# Patient Record
Sex: Female | Born: 1945 | Race: White | Hispanic: No | Marital: Married | Smoking: Former smoker
Health system: Southern US, Community
[De-identification: ages and names within clinical notes are randomized; demographics above are authoritative.]

## PROBLEM LIST (undated history)

## (undated) DIAGNOSIS — F40243 Fear of flying: Secondary | ICD-10-CM

## (undated) DIAGNOSIS — S43006A Unspecified dislocation of unspecified shoulder joint, initial encounter: Secondary | ICD-10-CM

## (undated) DIAGNOSIS — I10 Essential (primary) hypertension: Secondary | ICD-10-CM

## (undated) DIAGNOSIS — Z860100 Personal history of colon polyps, unspecified: Secondary | ICD-10-CM

## (undated) HISTORY — DX: Personal history of colonic polyps: Z86.010

## (undated) HISTORY — DX: Essential (primary) hypertension: I10

## (undated) HISTORY — DX: Unspecified dislocation of unspecified shoulder joint, initial encounter: S43.006A

## (undated) HISTORY — DX: Personal history of colon polyps, unspecified: Z86.0100

## (undated) HISTORY — DX: Fear of flying: F40.243

---

## 1997-01-22 HISTORY — PX: HERNIA REPAIR: SHX51

## 1997-08-25 ENCOUNTER — Other Ambulatory Visit: Admission: RE | Admit: 1997-08-25 | Discharge: 1997-08-25 | Payer: Self-pay | Admitting: *Deleted

## 1998-02-17 ENCOUNTER — Ambulatory Visit (HOSPITAL_BASED_OUTPATIENT_CLINIC_OR_DEPARTMENT_OTHER): Admission: RE | Admit: 1998-02-17 | Discharge: 1998-02-17 | Payer: Self-pay | Admitting: General Surgery

## 1998-11-07 ENCOUNTER — Other Ambulatory Visit: Admission: RE | Admit: 1998-11-07 | Discharge: 1998-11-07 | Payer: Self-pay | Admitting: *Deleted

## 1999-12-28 ENCOUNTER — Other Ambulatory Visit: Admission: RE | Admit: 1999-12-28 | Discharge: 1999-12-28 | Payer: Self-pay | Admitting: *Deleted

## 2000-02-22 ENCOUNTER — Other Ambulatory Visit: Admission: RE | Admit: 2000-02-22 | Discharge: 2000-02-22 | Payer: Self-pay | Admitting: Internal Medicine

## 2000-02-22 ENCOUNTER — Encounter (INDEPENDENT_AMBULATORY_CARE_PROVIDER_SITE_OTHER): Payer: Self-pay | Admitting: Specialist

## 2001-02-25 ENCOUNTER — Other Ambulatory Visit: Admission: RE | Admit: 2001-02-25 | Discharge: 2001-02-25 | Payer: Self-pay | Admitting: *Deleted

## 2002-03-05 ENCOUNTER — Other Ambulatory Visit: Admission: RE | Admit: 2002-03-05 | Discharge: 2002-03-05 | Payer: Self-pay | Admitting: *Deleted

## 2002-11-10 ENCOUNTER — Encounter: Payer: Self-pay | Admitting: Rheumatology

## 2002-11-10 ENCOUNTER — Encounter: Admission: RE | Admit: 2002-11-10 | Discharge: 2002-11-10 | Payer: Self-pay | Admitting: Rheumatology

## 2004-06-02 ENCOUNTER — Ambulatory Visit: Payer: Self-pay | Admitting: Family Medicine

## 2004-07-03 ENCOUNTER — Ambulatory Visit: Payer: Self-pay | Admitting: Internal Medicine

## 2004-07-17 ENCOUNTER — Ambulatory Visit: Payer: Self-pay | Admitting: Family Medicine

## 2004-07-19 ENCOUNTER — Ambulatory Visit: Payer: Self-pay | Admitting: Internal Medicine

## 2005-04-11 ENCOUNTER — Ambulatory Visit: Payer: Self-pay | Admitting: Family Medicine

## 2005-10-16 ENCOUNTER — Ambulatory Visit: Payer: Self-pay | Admitting: Family Medicine

## 2005-10-23 ENCOUNTER — Ambulatory Visit: Payer: Self-pay | Admitting: Family Medicine

## 2006-05-10 ENCOUNTER — Encounter: Admission: RE | Admit: 2006-05-10 | Discharge: 2006-05-10 | Payer: Self-pay | Admitting: *Deleted

## 2007-02-12 DIAGNOSIS — J45991 Cough variant asthma: Secondary | ICD-10-CM

## 2007-03-07 ENCOUNTER — Ambulatory Visit: Payer: Self-pay | Admitting: Family Medicine

## 2007-03-21 ENCOUNTER — Ambulatory Visit: Payer: Self-pay | Admitting: Family Medicine

## 2007-03-21 LAB — CONVERTED CEMR LAB
ALT: 17 units/L (ref 0–35)
AST: 21 units/L (ref 0–37)
Albumin: 4 g/dL (ref 3.5–5.2)
Alkaline Phosphatase: 63 units/L (ref 39–117)
BUN: 8 mg/dL (ref 6–23)
Basophils Absolute: 0.1 10*3/uL (ref 0.0–0.1)
Basophils Relative: 0.9 % (ref 0.0–1.0)
Bilirubin, Direct: 0.2 mg/dL (ref 0.0–0.3)
CO2: 29 meq/L (ref 19–32)
Calcium: 9.7 mg/dL (ref 8.4–10.5)
Chloride: 100 meq/L (ref 96–112)
Cholesterol: 239 mg/dL (ref 0–200)
Creatinine, Ser: 0.9 mg/dL (ref 0.4–1.2)
Direct LDL: 131.5 mg/dL
Eosinophils Absolute: 0.1 10*3/uL (ref 0.0–0.6)
Eosinophils Relative: 1.1 % (ref 0.0–5.0)
GFR calc Af Amer: 82 mL/min
GFR calc non Af Amer: 68 mL/min
Glucose, Bld: 82 mg/dL (ref 70–99)
HCT: 40.7 % (ref 36.0–46.0)
HDL: 89.7 mg/dL (ref >39.0)
Hemoglobin: 13.6 g/dL (ref 12.0–15.0)
Lymphocytes Relative: 35.7 % (ref 12.0–46.0)
MCHC: 33.4 g/dL (ref 30.0–36.0)
MCV: 90.5 fL (ref 78.0–100.0)
Monocytes Absolute: 0.9 10*3/uL — ABNORMAL HIGH (ref 0.2–0.7)
Monocytes Relative: 6.7 % (ref 3.0–11.0)
Neutro Abs: 7.1 10*3/uL (ref 1.4–7.7)
Neutrophils Relative %: 55.6 % (ref 43.0–77.0)
Platelets: 329 10*3/uL (ref 150–400)
Potassium: 3.6 meq/L (ref 3.5–5.1)
RBC: 4.49 M/uL (ref 3.87–5.11)
RDW: 13.5 % (ref 11.5–14.6)
Sodium: 140 meq/L (ref 135–145)
TSH: 1.11 microintl units/mL (ref 0.35–5.50)
Total Bilirubin: 1.1 mg/dL (ref 0.3–1.2)
Total CHOL/HDL Ratio: 2.7
Total Protein: 6.7 g/dL (ref 6.0–8.3)
Triglycerides: 82 mg/dL (ref 0–149)
VLDL: 16 mg/dL (ref 0–40)
WBC: 12.8 10*3/uL — ABNORMAL HIGH (ref 4.5–10.5)

## 2007-04-10 ENCOUNTER — Ambulatory Visit: Payer: Self-pay | Admitting: Family Medicine

## 2007-04-10 DIAGNOSIS — I10 Essential (primary) hypertension: Secondary | ICD-10-CM | POA: Insufficient documentation

## 2007-05-28 ENCOUNTER — Encounter: Admission: RE | Admit: 2007-05-28 | Discharge: 2007-05-28 | Payer: Self-pay | Admitting: *Deleted

## 2007-07-22 ENCOUNTER — Telehealth: Payer: Self-pay | Admitting: *Deleted

## 2007-10-09 ENCOUNTER — Ambulatory Visit: Payer: Self-pay | Admitting: Family Medicine

## 2008-04-13 ENCOUNTER — Telehealth: Payer: Self-pay | Admitting: Family Medicine

## 2008-05-14 ENCOUNTER — Ambulatory Visit: Payer: Self-pay | Admitting: Family Medicine

## 2008-05-14 LAB — CONVERTED CEMR LAB
ALT: 20 units/L (ref 0–35)
AST: 23 units/L (ref 0–37)
Albumin: 4.4 g/dL (ref 3.5–5.2)
Alkaline Phosphatase: 73 units/L (ref 39–117)
BUN: 11 mg/dL (ref 6–23)
Basophils Absolute: 0.1 10*3/uL (ref 0.0–0.1)
Basophils Relative: 1 % (ref 0.0–3.0)
Bilirubin, Direct: 0.1 mg/dL (ref 0.0–0.3)
CO2: 29 meq/L (ref 19–32)
Calcium: 9.7 mg/dL (ref 8.4–10.5)
Chloride: 104 meq/L (ref 96–112)
Cholesterol: 273 mg/dL — ABNORMAL HIGH (ref 0–200)
Creatinine, Ser: 0.8 mg/dL (ref 0.4–1.2)
Direct LDL: 170.1 mg/dL
Eosinophils Absolute: 0.1 10*3/uL (ref 0.0–0.7)
Eosinophils Relative: 1.3 % (ref 0.0–5.0)
GFR calc non Af Amer: 77.01 mL/min (ref >60)
Glucose, Bld: 115 mg/dL — ABNORMAL HIGH (ref 70–99)
HCT: 38.1 % (ref 36.0–46.0)
HDL: 59.9 mg/dL (ref >39.00)
Hemoglobin: 13.2 g/dL (ref 12.0–15.0)
Lymphocytes Relative: 26.1 % (ref 12.0–46.0)
Lymphs Abs: 1.7 10*3/uL (ref 0.7–4.0)
MCHC: 34.6 g/dL (ref 30.0–36.0)
MCV: 89.3 fL (ref 78.0–100.0)
Monocytes Absolute: 0.5 10*3/uL (ref 0.1–1.0)
Monocytes Relative: 7.4 % (ref 3.0–12.0)
Neutro Abs: 4.2 10*3/uL (ref 1.4–7.7)
Neutrophils Relative %: 64.2 % (ref 43.0–77.0)
Platelets: 257 10*3/uL (ref 150.0–400.0)
Potassium: 3.8 meq/L (ref 3.5–5.1)
RBC: 4.26 M/uL (ref 3.87–5.11)
RDW: 12.3 % (ref 11.5–14.6)
Sodium: 141 meq/L (ref 135–145)
TSH: 0.83 microintl units/mL (ref 0.35–5.50)
Total Bilirubin: 0.9 mg/dL (ref 0.3–1.2)
Total CHOL/HDL Ratio: 5
Total Protein: 7.7 g/dL (ref 6.0–8.3)
Triglycerides: 119 mg/dL (ref 0.0–149.0)
VLDL: 23.8 mg/dL (ref 0.0–40.0)
WBC: 6.6 10*3/uL (ref 4.5–10.5)

## 2008-05-21 ENCOUNTER — Ambulatory Visit: Payer: Self-pay | Admitting: Family Medicine

## 2008-05-25 ENCOUNTER — Telehealth: Payer: Self-pay | Admitting: Family Medicine

## 2008-07-15 ENCOUNTER — Ambulatory Visit: Payer: Self-pay | Admitting: Family Medicine

## 2008-07-15 LAB — CONVERTED CEMR LAB
ALT: 22 units/L (ref 0–35)
Albumin: 4.2 g/dL (ref 3.5–5.2)
Cholesterol: 168 mg/dL (ref 0–200)
HDL: 66.4 mg/dL (ref >39.00)
Total Protein: 7.3 g/dL (ref 6.0–8.3)
Triglycerides: 89 mg/dL (ref 0.0–149.0)
VLDL: 17.8 mg/dL (ref 0.0–40.0)

## 2008-07-22 ENCOUNTER — Ambulatory Visit: Payer: Self-pay | Admitting: Family Medicine

## 2008-07-22 DIAGNOSIS — E785 Hyperlipidemia, unspecified: Secondary | ICD-10-CM | POA: Insufficient documentation

## 2008-10-25 ENCOUNTER — Encounter: Admission: RE | Admit: 2008-10-25 | Discharge: 2008-10-25 | Payer: Self-pay | Admitting: Obstetrics & Gynecology

## 2008-11-11 ENCOUNTER — Telehealth: Payer: Self-pay | Admitting: Family Medicine

## 2009-06-03 ENCOUNTER — Ambulatory Visit: Payer: Self-pay | Admitting: Family Medicine

## 2009-06-07 DIAGNOSIS — C4490 Unspecified malignant neoplasm of skin, unspecified: Secondary | ICD-10-CM

## 2009-06-08 ENCOUNTER — Telehealth: Payer: Self-pay | Admitting: Family Medicine

## 2009-08-12 ENCOUNTER — Ambulatory Visit: Payer: Self-pay | Admitting: Family Medicine

## 2009-08-12 DIAGNOSIS — D729 Disorder of white blood cells, unspecified: Secondary | ICD-10-CM | POA: Insufficient documentation

## 2009-08-12 LAB — CONVERTED CEMR LAB
Basophils Relative: 0.7 % (ref 0.0–3.0)
Eosinophils Absolute: 0.1 10*3/uL (ref 0.0–0.7)
Lymphocytes Relative: 21.5 % (ref 12.0–46.0)
MCHC: 34.6 g/dL (ref 30.0–36.0)
Monocytes Relative: 7.8 % (ref 3.0–12.0)
Neutrophils Relative %: 69.5 % (ref 43.0–77.0)
RBC: 4.24 M/uL (ref 3.87–5.11)
WBC: 9.3 10*3/uL (ref 4.5–10.5)

## 2009-08-29 ENCOUNTER — Ambulatory Visit: Payer: Self-pay | Admitting: Family Medicine

## 2009-08-29 LAB — CONVERTED CEMR LAB
ALT: 15 units/L (ref 0–35)
Albumin: 4.3 g/dL (ref 3.5–5.2)
BUN: 13 mg/dL (ref 6–23)
Basophils Relative: 0.9 % (ref 0.0–3.0)
Chloride: 102 meq/L (ref 96–112)
Cholesterol: 154 mg/dL (ref 0–200)
Eosinophils Relative: 2.5 % (ref 0.0–5.0)
HCT: 35.7 % — ABNORMAL LOW (ref 36.0–46.0)
Lymphs Abs: 1.8 10*3/uL (ref 0.7–4.0)
MCV: 90.6 fL (ref 78.0–100.0)
Monocytes Absolute: 0.5 10*3/uL (ref 0.1–1.0)
Platelets: 278 10*3/uL (ref 150.0–400.0)
Potassium: 4.4 meq/L (ref 3.5–5.1)
RBC: 3.94 M/uL (ref 3.87–5.11)
TSH: 1.01 microintl units/mL (ref 0.35–5.50)
Total Protein: 6.8 g/dL (ref 6.0–8.3)
WBC: 5.8 10*3/uL (ref 4.5–10.5)

## 2009-09-06 ENCOUNTER — Ambulatory Visit: Payer: Self-pay | Admitting: Family Medicine

## 2009-09-07 ENCOUNTER — Ambulatory Visit: Payer: Self-pay | Admitting: Family Medicine

## 2010-02-02 ENCOUNTER — Telehealth: Payer: Self-pay | Admitting: *Deleted

## 2010-02-21 NOTE — Assessment & Plan Note (Signed)
Summary: tdap inj/njr   Nurse Visit   Allergies: 1)  ! Penicillin V Potassium (Penicillin V Potassium) 2)  ! Bactrim Ds (Sulfamethoxazole-Trimethoprim)  Immunizations Administered:  Tetanus Vaccine:    Vaccine Type: Tdap    Site: right deltoid    Mfr: GlaxoSmithKline    Dose: 0.5 ml    Route: IM    Given by: Kern Reap CMA (AAMA)    Exp. Date: 11/11/2011    Lot #: [id number]    VIS given: 12/10/06 version given September 07, 2009.    Physician counseled: yes  Orders Added: 1)  Tdap => 3yrs IM [90715] 2)  Admin 1st Vaccine [57846]

## 2010-02-21 NOTE — Progress Notes (Signed)
Summary: [facility] -Pt sch for Mohs surgery 06/14/09  Phone Note Call from Patient   Caller: [facility] - [place] Summary of Call: Pt has been for Mohs surgery 06/14/09 at 11:00am with Dr. Irene Limbo. Just wanted to make Dr Tawanna Cooler aware.  Initial call taken by: Lucy Antigua,  Jun 08, 2009 12:38 PM

## 2010-02-21 NOTE — Assessment & Plan Note (Signed)
Summary: INFECTION (SORE ON NECK/SHOULDER AREA) // RS   Procedure Note Last Tetanus: Historical (10/23/2005)  Mole Biopsy/Removal: Indication: inflamed lesion Consent signed: yes  Procedure # 1: elliptical incision with 2 mm margin    Size (in cm): 1.2 x 1.2    Region: anterior    Location: chest-left    Instrument used: #15 blade    Anesthesia: 1% lidocaine w/epinephrine    Closure: cautery  Cleaned and prepped with: alcohol Wound dressing: neosporin and bandaid   History of Present Illness: Nicole [name] is a 65 year old female, who comes in today for evaluation of a red, irritated lesion on her anterior chest wall.  Allergies: 1)  ! Penicillin V Potassium (Penicillin V Potassium) 2)  ! Bactrim Ds (Sulfamethoxazole-Trimethoprim)   Complete Medication List: 1)  Adult Aspirin Ec Low Strength 81 Mg Tbec (Aspirin) .... Once daily 2)  Alprazolam 0.25 Mg Tabs (Alprazolam) .... Take one tab once daily as needed 3)  Lisinopril-hydrochlorothiazide 20-25 Mg Tabs (Lisinopril-hydrochlorothiazide) .... Take one tab once daily 4)  Zocor 20 Mg Tabs (Simvastatin) .... Take one tab at bedtime  Other Orders: Excise Malig lesion (FEENL) 0.6 - 1.0 cm (11641)

## 2010-02-21 NOTE — Miscellaneous (Signed)
Summary: Consent for Mole Removal  Consent for Mole Removal   Imported By: Maryln Gottron 06/08/2009 13:15:05  _____________________________________________________________________  External Attachment:    Type:   Image     Comment:   External Document

## 2010-02-21 NOTE — Assessment & Plan Note (Signed)
Summary: cpx/cjr   Vital Signs:  Patient profile:   65 year old female Menstrual status:  postmenopausal Height:      64.25 inches Weight:      151 pounds Temp:     98.6 degrees F oral BP sitting:   124 / 78  (left arm) Cuff size:   regular  Vitals Entered By: Kern Reap CMA Duncan Dull) (September 06, 2009 10:44 AM) CC: yearly well check   CC:  yearly well check.  History of Present Illness: aleksa is a 65 y/o female nonsmoker, retired Runner, broadcasting/film/video, who comes in today for evaluation of hypertension, and hyperlipidemia.  Her hypertension is treated with Zestoretic 20 to 25 q.a.m., BP 124/78.  Her hyperlipidemia, history of Zocor 20 mg nightly lipids are normal.  She also takes an 81-mg baby aspirin.  Praise when 25 daily p.r.n.  She gets routine eye care, dental care, BSE monthly, annual mammography, colonoscopy, 2008 normal prior to that.  She had a polyp.  Tetanus 2007 Pneumovax 2010 she states she does not want to take a flu shot because it one time she developed some soreness in her hands 3 weeks after a flu shot.  She ended up going to see the rheumatologist for evaluation.  All the rheumatology studies were negative.  His resolve spontaneously.  Allergies: 1)  ! Penicillin V Potassium (Penicillin V Potassium) 2)  ! Bactrim Ds (Sulfamethoxazole-Trimethoprim)  Past History:  Past medical, surgical, family and social histories (including risk factors) reviewed, and no changes noted (except as noted below).  Past Medical History: Reviewed history from 04/10/2007 and no changes required. childbirth x 1 allergic rhinitis hypertension history of colon polyps history of asymptomatic hematuria flying phobia dislocated right shoulder February 2009  Family History: Reviewed history from 07/22/2008 and no changes required. father died at 15 of lymphoma mother died 67, hypertension, osteoporosis one brother has hypertension, arthritis.  No sisters  massive MI  Social  History: Reviewed history from 04/10/2007 and no changes required. Occupation:teacher Married Former Smoker Alcohol use-no Drug use-no Regular exercise-no  Review of Systems      See HPI  Physical Exam  General:  Well-developed,well-nourished,in no acute distress; alert,appropriate and cooperative throughout examination Head:  Normocephalic and atraumatic without obvious abnormalities. No apparent alopecia or balding. Eyes:  No corneal or conjunctival inflammation noted. EOMI. Perrla. Funduscopic exam benign, without hemorrhages, exudates or papilledema. Vision grossly normal. Ears:  External ear exam shows no significant lesions or deformities.  Otoscopic examination reveals clear canals, tympanic membranes are intact bilaterally without bulging, retraction, inflammation or discharge. Hearing is grossly normal bilaterally. Nose:  External nasal examination shows no deformity or inflammation. Nasal mucosa are pink and moist without lesions or exudates. Mouth:  Oral mucosa and oropharynx without lesions or exudates.  Teeth in good repair. Neck:  No deformities, masses, or tenderness noted. Chest Wall:  No deformities, masses, or tenderness noted. Breasts:  No mass, nodules, thickening, tenderness, bulging, retraction, inflamation, nipple discharge or skin changes noted.   Lungs:  Normal respiratory effort, chest expands symmetrically. Lungs are clear to auscultation, no crackles or wheezes. Heart:  Normal rate and regular rhythm. S1 and S2 normal without gallop, murmur, click, rub or other extra sounds. Abdomen:  Bowel sounds positive,abdomen soft and non-tender without masses, organomegaly or hernias noted. Msk:  No deformity or scoliosis noted of thoracic or lumbar spine.   Pulses:  R and L carotid,radial,femoral,dorsalis pedis and posterior tibial pulses are full and equal bilaterally Extremities:  No clubbing,  cyanosis, edema, or deformity noted with normal full range of motion of all  joints.   Neurologic:  No cranial nerve deficits noted. Station and gait are normal. Plantar reflexes are down-going bilaterally. DTRs are symmetrical throughout. Sensory, motor and coordinative functions appear intact. Skin:  total body skin exam normal except for scars from the previous skin cancer that we removed from the left supra-clavicular area squamous cell carcinoma and then send her to dermatology. Cervical Nodes:  No lymphadenopathy noted Axillary Nodes:  No palpable lymphadenopathy Inguinal Nodes:  No significant adenopathy Psych:  Cognition and judgment appear intact. Alert and cooperative with normal attention span and concentration. No apparent delusions, illusions, hallucinations   Impression & Recommendations:  Problem # 1:  HYPERLIPIDEMIA (ICD-272.4) Assessment Improved  Her updated medication list for this problem includes:    Zocor 20 Mg Tabs (Simvastatin) .Marland Kitchen... Take one tab at bedtime  Orders: Prescription Created Electronically [phone]) EKG w/ Interpretation (93000)  Problem # 2:  HYPERTENSION NEC (ICD-997.91) Assessment: Improved  Orders: Prescription Created Electronically [phone]) EKG w/ Interpretation (93000)  Problem # 3:  ROUTINE GENERAL MEDICAL EXAM@HEALTH  CARE FACL (ICD-V70.0) Assessment: Unchanged  Orders: Prescription Created Electronically [phone])  Complete Medication List: 1)  Adult Aspirin Ec Low Strength 81 Mg Tbec (Aspirin) .... Once daily 2)  Alprazolam 0.25 Mg Tabs (Alprazolam) .... Take one tab once daily as needed 3)  Lisinopril-hydrochlorothiazide 20-25 Mg Tabs (Lisinopril-hydrochlorothiazide) .... Take one tab once daily 4)  Zocor 20 Mg Tabs (Simvastatin) .... Take one tab at bedtime  Patient Instructions: 1)  Please schedule a follow-up appointment in 1 year. 2)  It is important that you exercise regularly at least 20 minutes 5 times a week. If you develop chest pain, have severe difficulty breathing, or feel very tired , stop  exercising immediately and seek medical attention. 3)  Schedule your mammogram. 4)  Schedule a colonoscopy/sigmoidoscopy to help detect colon cancer. 5)  Take calcium +Vitamin D daily. 6)  Take an Aspirin every day. Prescriptions: ZOCOR 20 MG TABS (SIMVASTATIN) take one tab at bedtime  #100 Tablet x 3   Entered and Authorized by:   Roderick Pee MD   Signed by:   Roderick Pee MD on 09/06/2009   Method used:   Electronically to        [id number] (retail)       [address]       [place], [place]  [address]       Ph: [phone] or [phone]       Fax: [phone]   RxID:   [id number] LISINOPRIL-HYDROCHLOROTHIAZIDE 20-25 MG TABS (LISINOPRIL-HYDROCHLOROTHIAZIDE) take one tab once daily  #100 x 3   Entered and Authorized by:   Roderick Pee MD   Signed by:   Roderick Pee MD on 09/06/2009   Method used:   Electronically to        [id number] (retail)       [address]       [place], [place]  [address]       Ph: [phone] or [phone]       Fax: [phone]   RxID:   [id number]
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
## 2010-02-23 NOTE — Progress Notes (Signed)
Summary: refill on alprazolam  Phone Note From Pharmacy   Caller: [facility] Reason for Call: Needs renewal Details for Reason: alprazolam 0.25mg  Summary of Call: last filled on 09/10/09 #30 Initial call taken by: Romualdo Bolk, CMA (AAMA),  February 02, 2010 11:06 AM  Follow-up for Phone Call        0k......dispense 30 tablets ........ use as directed, refill x 3 Follow-up by: Roderick Pee MD,  February 02, 2010 11:24 AM  Additional Follow-up for Phone Call Additional follow up Details #1::        rx faxed to pharmacy Additional Follow-up by: Romualdo Bolk, CMA (AAMA),  February 02, 2010 11:26 AM    Prescriptions: ALPRAZOLAM 0.25 MG  TABS (ALPRAZOLAM) take one tab once daily as needed  #30 x 3   Entered by:   Romualdo Bolk, CMA (AAMA)   Authorized by:   Roderick Pee MD   Signed by:   Romualdo Bolk, CMA (AAMA) on 02/02/2010   Method used:   Handwritten   RxID:   [id number]

## 2010-02-24 ENCOUNTER — Other Ambulatory Visit: Payer: Self-pay | Admitting: Obstetrics & Gynecology

## 2010-02-24 DIAGNOSIS — Z1231 Encounter for screening mammogram for malignant neoplasm of breast: Secondary | ICD-10-CM

## 2010-03-13 ENCOUNTER — Ambulatory Visit: Payer: Self-pay

## 2010-03-15 ENCOUNTER — Ambulatory Visit
Admission: RE | Admit: 2010-03-15 | Discharge: 2010-03-15 | Disposition: A | Discharge status: Home / Self Care | Payer: BC Managed Care – PPO | Source: Ambulatory Visit | Attending: Obstetrics & Gynecology | Admitting: Obstetrics & Gynecology

## 2010-03-15 DIAGNOSIS — Z1231 Encounter for screening mammogram for malignant neoplasm of breast: Secondary | ICD-10-CM

## 2010-06-09 NOTE — Assessment & Plan Note (Signed)
[facility]                                 ON-CALL NOTE   DORAL, DIGANGI                        MRN:          [id number]  DATE:05/05/2006                            DOB:          [birth year]    She called from [phone] at 8:01 a.m. on May 05, 2006, complaining of  fever and congestion that started on Friday.  Patient is in [place] and  just wants to know what she can do until she can come in and be seen in  the office.  Patient has tried Benadryl and aspirin with very little  relief.  I recommended that if she felt she needed an antibiotic she be  seen by urgent care in [place], although she could try some Zyrtec in  place of the Benadryl and use Afrin Nasal Spray just for a couple of  days until she could get into the office and see Dr. Tawanna Cooler.  I also  recommended that she get some Tylenol or ibuprofen for the fever, and  again it was recommended if the patient felt like she needed more than  those things that she should be seen by urgent care in [place] to get an  antibiotic.     Lelon Perla, DO  Electronically Signed    Shawnie Dapper  DD: 05/05/2006  DT: 05/05/2006  Job #: [id number]   cc:   Tinnie Gens A. Tawanna Cooler, MD

## 2010-08-21 ENCOUNTER — Other Ambulatory Visit: Payer: Self-pay | Admitting: Family Medicine

## 2010-09-21 ENCOUNTER — Other Ambulatory Visit: Payer: BC Managed Care – PPO

## 2010-09-26 ENCOUNTER — Ambulatory Visit: Payer: BC Managed Care – PPO | Admitting: Family Medicine

## 2010-10-19 ENCOUNTER — Ambulatory Visit: Payer: BC Managed Care – PPO | Admitting: Family Medicine

## 2010-11-23 ENCOUNTER — Other Ambulatory Visit (INDEPENDENT_AMBULATORY_CARE_PROVIDER_SITE_OTHER): Payer: Medicare Other

## 2010-11-23 DIAGNOSIS — Z0000 Encounter for general adult medical examination without abnormal findings: Secondary | ICD-10-CM

## 2010-11-23 LAB — BASIC METABOLIC PANEL
BUN: 12 mg/dL (ref 6–23)
CO2: 22 mEq/L (ref 19–32)
Chloride: 103 mEq/L (ref 96–112)
Creatinine, Ser: 0.9 mg/dL (ref 0.4–1.2)
Glucose, Bld: 97 mg/dL (ref 70–99)

## 2010-11-23 LAB — LIPID PANEL
Cholesterol: 268 mg/dL — ABNORMAL HIGH (ref 0–200)
HDL: 71.5 mg/dL (ref >39.00)
Total CHOL/HDL Ratio: 4
Triglycerides: 129 mg/dL (ref 0.0–149.0)

## 2010-11-23 LAB — HEPATIC FUNCTION PANEL
Albumin: 4.7 g/dL (ref 3.5–5.2)
Bilirubin, Direct: 0 mg/dL (ref 0.0–0.3)
Total Protein: 7.5 g/dL (ref 6.0–8.3)

## 2010-11-23 LAB — CBC WITH DIFFERENTIAL/PLATELET
Basophils Relative: 0.8 % (ref 0.0–3.0)
Eosinophils Absolute: 0.1 10*3/uL (ref 0.0–0.7)
MCHC: 33.9 g/dL (ref 30.0–36.0)
MCV: 92.5 fl (ref 78.0–100.0)
Monocytes Absolute: 0.4 10*3/uL (ref 0.1–1.0)
Neutrophils Relative %: 53.2 % (ref 43.0–77.0)
RBC: 4.25 Mil/uL (ref 3.87–5.11)

## 2010-11-24 LAB — LDL CHOLESTEROL, DIRECT: Direct LDL: 178.5 mg/dL

## 2010-11-27 ENCOUNTER — Ambulatory Visit: Payer: BC Managed Care – PPO | Admitting: Family Medicine

## 2010-11-30 ENCOUNTER — Encounter: Payer: Self-pay | Admitting: Family Medicine

## 2010-11-30 ENCOUNTER — Ambulatory Visit (INDEPENDENT_AMBULATORY_CARE_PROVIDER_SITE_OTHER): Payer: Medicare Other | Admitting: Family Medicine

## 2010-11-30 DIAGNOSIS — E785 Hyperlipidemia, unspecified: Secondary | ICD-10-CM

## 2010-11-30 DIAGNOSIS — D729 Disorder of white blood cells, unspecified: Secondary | ICD-10-CM

## 2010-11-30 DIAGNOSIS — IMO0002 Reserved for concepts with insufficient information to code with codable children: Secondary | ICD-10-CM

## 2010-11-30 DIAGNOSIS — Z0000 Encounter for general adult medical examination without abnormal findings: Secondary | ICD-10-CM

## 2010-11-30 MED ORDER — SIMVASTATIN 20 MG PO TABS: 20.0000 mg | ORAL_TABLET | Freq: Every day | ORAL | Status: DC

## 2010-11-30 MED ORDER — LISINOPRIL-HYDROCHLOROTHIAZIDE 20-25 MG PO TABS: 1.0000 | ORAL_TABLET | Freq: Every day | ORAL | Status: DC

## 2010-11-30 MED ORDER — ALPRAZOLAM 0.25 MG PO TABS: 0.2500 mg | ORAL_TABLET | Freq: Every evening | ORAL | Status: DC | PRN

## 2010-11-30 NOTE — Progress Notes (Signed)
  Subjective:    Patient ID: Nicole [name], female    DOB: 1945/12/22, 65 y.o.   MRN: [id number]  HPI Nicole [name] is a delightful, 65 year old, married female, nonsmoker, who comes in today for Medicare wellness examination.  She has a history of hypertension, treated with Zestoretic 20 -- 12.5 daily.  BP 124/80.  She was on simvastatin 20 mg nightly however, she stopped it because she didn't feel well.  She states it caused her to feel restless, and she didn't sleep well at night.  Now, lipids, gone back up.  Advised to restart Monday, Wednesday, Friday.  She also takes .25 mg of Xanax one hour before flying.  She has a fear of airplanes.  She gets routine eye care, hearing normal, regular dental care, colonoscopy, history of polyps, she does not do BSE monthly.  However, she does continue mammogram.  Tetanus 2011, Pneumovax 2010, declines a flu shot because she said she had a reaction and had a sore arm for 3 weeks.  Information given on shingles.  Cognitive function, normal, she does all her own finances, home health safety reviewed.  No issues identified, guns in the house, but the locked up, she does have a healthcare power of attorney, but not a living will.  Three of her relatives are attorneys   Review of Systems  Constitutional: Negative.   HENT: Negative.   Eyes: Negative.   Respiratory: Negative.   Cardiovascular: Negative.   Gastrointestinal: Negative.   Genitourinary: Negative.   Musculoskeletal: Negative.   Neurological: Negative.   Hematological: Negative.   Psychiatric/Behavioral: Negative.        Objective:   Physical Exam  Constitutional: She appears well-developed and well-nourished.  HENT:  Head: Normocephalic and atraumatic.  Right Ear: External ear normal.  Left Ear: External ear normal.  Nose: Nose normal.  Mouth/Throat: Oropharynx is clear and moist.  Eyes: EOM are normal. Pupils are equal, round, and reactive to light.  Neck: Normal range of motion. Neck  supple. No thyromegaly present.  Cardiovascular: Normal rate, regular rhythm, normal heart sounds and intact distal pulses.  Exam reveals no gallop and no friction rub.   No murmur heard. Pulmonary/Chest: Effort normal and breath sounds normal.  Abdominal: Soft. Bowel sounds are normal. She exhibits no distension and no mass. There is no tenderness. There is no rebound.  Genitourinary:       Bilateral breast exam normal  Musculoskeletal: Normal range of motion.  Lymphadenopathy:    She has no cervical adenopathy.  Neurological: She is alert. She has normal reflexes. No cranial nerve deficit. She exhibits normal muscle tone. Coordination normal.  Skin: Skin is warm and dry.  Psychiatric: She has a normal mood and affect. Her behavior is normal. Judgment and thought content normal.          Assessment & Plan:  Healthy female.  Continue Zestoretic one daily.  Hyperlipidemia.  Restart Zocor, and aspirin.  Inferior of flying, Xanax, .25 mg one hour before plane trip.  Return in one year, sooner if any problems, and consider the shingles, vaccine

## 2010-11-30 NOTE — Patient Instructions (Signed)
Continue your current medications.  Remember to walk 30 minutes daily outside.  Follow-up in one year, sooner if any problems.  Consider the shingles, vaccine

## 2011-05-08 ENCOUNTER — Encounter: Payer: Self-pay | Admitting: Internal Medicine

## 2011-06-08 ENCOUNTER — Other Ambulatory Visit: Payer: Self-pay | Admitting: Obstetrics & Gynecology

## 2011-06-08 DIAGNOSIS — Z1231 Encounter for screening mammogram for malignant neoplasm of breast: Secondary | ICD-10-CM

## 2011-06-20 ENCOUNTER — Other Ambulatory Visit: Payer: Self-pay | Admitting: *Deleted

## 2011-06-20 MED ORDER — ALPRAZOLAM 0.25 MG PO TABS: 0.2500 mg | ORAL_TABLET | Freq: Every evening | ORAL | Status: DC | PRN

## 2011-06-26 ENCOUNTER — Ambulatory Visit
Admission: RE | Admit: 2011-06-26 | Discharge: 2011-06-26 | Disposition: A | Discharge status: Home / Self Care | Payer: Medicare Other | Source: Ambulatory Visit | Attending: Obstetrics & Gynecology | Admitting: Obstetrics & Gynecology

## 2011-06-26 DIAGNOSIS — Z1231 Encounter for screening mammogram for malignant neoplasm of breast: Secondary | ICD-10-CM

## 2011-09-11 ENCOUNTER — Telehealth: Payer: Self-pay | Admitting: Family Medicine

## 2011-09-11 DIAGNOSIS — E785 Hyperlipidemia, unspecified: Secondary | ICD-10-CM

## 2011-09-11 DIAGNOSIS — IMO0002 Reserved for concepts with insufficient information to code with codable children: Secondary | ICD-10-CM

## 2011-09-11 NOTE — Telephone Encounter (Signed)
Pt is requesting cpx labs in advance pt has medicare.

## 2011-09-12 NOTE — Telephone Encounter (Signed)
Labs ordered.

## 2011-12-17 ENCOUNTER — Other Ambulatory Visit: Payer: Self-pay | Admitting: Family Medicine

## 2012-01-01 ENCOUNTER — Other Ambulatory Visit (INDEPENDENT_AMBULATORY_CARE_PROVIDER_SITE_OTHER): Payer: Medicare Other

## 2012-01-01 DIAGNOSIS — IMO0002 Reserved for concepts with insufficient information to code with codable children: Secondary | ICD-10-CM

## 2012-01-01 DIAGNOSIS — E785 Hyperlipidemia, unspecified: Secondary | ICD-10-CM

## 2012-01-01 LAB — TSH: TSH: 1.48 u[IU]/mL (ref 0.35–5.50)

## 2012-01-01 LAB — CBC WITH DIFFERENTIAL/PLATELET
Basophils Absolute: 0 10*3/uL (ref 0.0–0.1)
Eosinophils Absolute: 0.2 10*3/uL (ref 0.0–0.7)
Lymphocytes Relative: 38.4 % (ref 12.0–46.0)
Monocytes Relative: 7.9 % (ref 3.0–12.0)
Platelets: 323 10*3/uL (ref 150.0–400.0)
RDW: 13.9 % (ref 11.5–14.6)

## 2012-01-01 LAB — LIPID PANEL
Cholesterol: 263 mg/dL — ABNORMAL HIGH (ref 0–200)
Triglycerides: 162 mg/dL — ABNORMAL HIGH (ref 0.0–149.0)

## 2012-01-01 LAB — HEPATIC FUNCTION PANEL
ALT: 22 U/L (ref 0–35)
AST: 25 U/L (ref 0–37)
Albumin: 4.5 g/dL (ref 3.5–5.2)
Total Protein: 7.7 g/dL (ref 6.0–8.3)

## 2012-01-01 LAB — BASIC METABOLIC PANEL
Calcium: 9.7 mg/dL (ref 8.4–10.5)
Chloride: 99 mEq/L (ref 96–112)
Creatinine, Ser: 1 mg/dL (ref 0.4–1.2)
GFR: 62.45 mL/min (ref >60.00)

## 2012-01-01 LAB — LDL CHOLESTEROL, DIRECT: Direct LDL: 172.5 mg/dL

## 2012-01-08 ENCOUNTER — Ambulatory Visit (INDEPENDENT_AMBULATORY_CARE_PROVIDER_SITE_OTHER): Payer: Medicare Other | Admitting: Family Medicine

## 2012-01-08 ENCOUNTER — Encounter: Payer: Self-pay | Admitting: Family Medicine

## 2012-01-08 VITALS — BP 140/90 | Temp 98.2°F | Ht 64.25 in | Wt 152.0 lb

## 2012-01-08 DIAGNOSIS — C4490 Unspecified malignant neoplasm of skin, unspecified: Secondary | ICD-10-CM

## 2012-01-08 DIAGNOSIS — IMO0002 Reserved for concepts with insufficient information to code with codable children: Secondary | ICD-10-CM

## 2012-01-08 DIAGNOSIS — E785 Hyperlipidemia, unspecified: Secondary | ICD-10-CM

## 2012-01-08 DIAGNOSIS — Z0000 Encounter for general adult medical examination without abnormal findings: Secondary | ICD-10-CM

## 2012-01-08 DIAGNOSIS — I10 Essential (primary) hypertension: Secondary | ICD-10-CM

## 2012-01-08 MED ORDER — SIMVASTATIN 20 MG PO TABS: 20.0000 mg | ORAL_TABLET | Freq: Every day | ORAL | Status: DC

## 2012-01-08 MED ORDER — LISINOPRIL-HYDROCHLOROTHIAZIDE 20-25 MG PO TABS: ORAL_TABLET | ORAL | Status: DC

## 2012-01-08 NOTE — Progress Notes (Signed)
  Subjective:    Patient ID: Nicole [name], female    DOB: 27-Feb-1945, 66 y.o.   MRN: [id number]  HPI To see 66 year old married female nonsmoker who comes in today for a Medicare wellness examination because of a history of hypertension, hyperlipidemia,  She also has a history of asymptomatic hematuria has had repeated evaluations over the past 15 years. She was told by her urologist and she no longer needs a yearly urologic evaluation which I concur with. I recommend we do a yearly urinalysis and as long as the same quantity of blood is there then I would not do any further studies.  During the evaluation of the hematuria she was noted to have a mass in her left ovary. GYN evaluation showed an ovarian cyst she is being followed every 6 months  She gets routine eye care, dental care, BSE monthly, and you mammography, colonoscopy and GI, tetanus 2011, Pneumovax 2010, information given on shingles, she declines a flu shot  Cognitive function normal she walks on a regular basis home health safety reviewed no issues identified, no guns in the house, she does have a health care power of attorney and living will   Review of Systems  Constitutional: Negative.   HENT: Negative.   Eyes: Negative.   Respiratory: Negative.   Cardiovascular: Negative.   Gastrointestinal: Negative.   Genitourinary: Negative.   Musculoskeletal: Negative.   Neurological: Negative.   Hematological: Negative.   Psychiatric/Behavioral: Negative.        Objective:   Physical Exam  Constitutional: She appears well-developed and well-nourished.  HENT:  Head: Normocephalic and atraumatic.  Right Ear: External ear normal.  Left Ear: External ear normal.  Nose: Nose normal.  Mouth/Throat: Oropharynx is clear and moist.  Eyes: EOM are normal. Pupils are equal, round, and reactive to light.  Neck: Normal range of motion. Neck supple. No thyromegaly present.  Cardiovascular: Normal rate, regular rhythm, normal heart  sounds and intact distal pulses.  Exam reveals no gallop and no friction rub.   No murmur heard. Pulmonary/Chest: Effort normal and breath sounds normal.  Abdominal: Soft. Bowel sounds are normal. She exhibits no distension and no mass. There is no tenderness. There is no rebound.  Genitourinary:       Bilateral breast exam normal  Musculoskeletal: Normal range of motion.  Lymphadenopathy:    She has no cervical adenopathy.  Neurological: She is alert. She has normal reflexes. No cranial nerve deficit. She exhibits normal muscle tone. Coordination normal.  Skin: Skin is warm and dry.  Psychiatric: She has a normal mood and affect. Her behavior is normal. Judgment and thought content normal.          Assessment & Plan:  Healthy female  Hypertension continue Zestoretic one tablet daily  Hyperlipidemia advised to take a 10 mg tablet Monday Wednesday Friday because of side effects of daily statin therapy  History of asymptomatic hematuria followup yearly  Left ovarian cyst followed by GYN

## 2012-01-08 NOTE — Patient Instructions (Addendum)
Continue your current medications,,,,,,,,,, except to take 10 mg of Zocor Monday Wednesday Friday  Return one year sooner if any problems

## 2012-03-17 ENCOUNTER — Other Ambulatory Visit: Payer: Self-pay | Admitting: Family Medicine

## 2012-04-30 ENCOUNTER — Encounter: Payer: Self-pay | Admitting: Internal Medicine

## 2012-05-01 ENCOUNTER — Encounter: Payer: Self-pay | Admitting: Internal Medicine

## 2012-06-03 ENCOUNTER — Telehealth: Payer: Self-pay | Admitting: Family Medicine

## 2012-06-03 NOTE — Telephone Encounter (Signed)
Okay to refill? 

## 2012-06-03 NOTE — Telephone Encounter (Signed)
okay

## 2012-06-03 NOTE — Telephone Encounter (Signed)
Pt would like a refill on alprazolam call [facility]

## 2012-06-09 ENCOUNTER — Other Ambulatory Visit: Payer: Self-pay | Admitting: *Deleted

## 2012-06-09 MED ORDER — ALPRAZOLAM 0.25 MG PO TABS: 0.2500 mg | ORAL_TABLET | Freq: Every evening | ORAL | Status: DC | PRN

## 2012-07-17 ENCOUNTER — Other Ambulatory Visit: Payer: Self-pay

## 2012-07-17 DIAGNOSIS — Z1231 Encounter for screening mammogram for malignant neoplasm of breast: Secondary | ICD-10-CM

## 2012-07-24 ENCOUNTER — Ambulatory Visit: Payer: Medicare Other

## 2012-07-24 ENCOUNTER — Ambulatory Visit
Admission: RE | Admit: 2012-07-24 | Discharge: 2012-07-24 | Disposition: A | Discharge status: Home / Self Care | Payer: Medicare Other | Source: Ambulatory Visit

## 2012-07-24 DIAGNOSIS — Z1231 Encounter for screening mammogram for malignant neoplasm of breast: Secondary | ICD-10-CM

## 2012-07-29 ENCOUNTER — Other Ambulatory Visit: Payer: Self-pay | Admitting: Obstetrics & Gynecology

## 2012-07-29 ENCOUNTER — Encounter (HOSPITAL_COMMUNITY): Payer: Self-pay | Admitting: Pharmacy Technician

## 2012-07-31 ENCOUNTER — Encounter (HOSPITAL_COMMUNITY)
Admission: RE | Admit: 2012-07-31 | Discharge: 2012-07-31 | Disposition: A | Discharge status: Home / Self Care | Payer: Medicare Other | Source: Ambulatory Visit | Attending: Obstetrics & Gynecology | Admitting: Obstetrics & Gynecology

## 2012-07-31 ENCOUNTER — Encounter (HOSPITAL_COMMUNITY): Payer: Self-pay

## 2012-07-31 ENCOUNTER — Other Ambulatory Visit: Payer: Self-pay

## 2012-07-31 LAB — CBC
Hemoglobin: 13.9 g/dL (ref 12.0–15.0)
MCHC: 34.7 g/dL (ref 30.0–36.0)
Platelets: 276 10*3/uL (ref 150–400)
RBC: 4.56 MIL/uL (ref 3.87–5.11)

## 2012-07-31 LAB — BASIC METABOLIC PANEL
CO2: 23 mEq/L (ref 19–32)
GFR calc non Af Amer: 85 mL/min — ABNORMAL LOW (ref >90)
Glucose, Bld: 105 mg/dL — ABNORMAL HIGH (ref 70–99)
Potassium: 3.6 mEq/L (ref 3.5–5.1)
Sodium: 134 mEq/L — ABNORMAL LOW (ref 135–145)

## 2012-07-31 NOTE — Pre-Procedure Instructions (Signed)
PAT assessment done by Quay Burow, RN working under [employer] EPIC sign in.

## 2012-07-31 NOTE — Patient Instructions (Addendum)
Your procedure is scheduled on: Thursday, July 17th  Enter through the Main Entrance at : 1:00 pm Pick up desk phone and dial [phone] and inform us of your arrival.   Please call [phone] if you have any problems the morning of surgery.  Remember: Do not eat food after midnight, may have clear liquids until 10:30 am   You may brush your teeth the morning of surgery.  Take these meds the morning of surgery with a sip of water: one Xanax if needed for anxiety Make sure to take you BP medicine (Lisinipril) at bedtime the night before.   DO NOT wear jewelry, eye make-up, lipstick,body lotion, or dark fingernail polish.  (Polished toes are ok) You may wear deodorant.  If you are to be admitted after surgery, leave suitcase in car until your room has been assigned. Patients discharged on the day of surgery will not be allowed to drive home. Wear loose fitting, comfortable clothes for your ride home.

## 2012-08-07 ENCOUNTER — Encounter (HOSPITAL_COMMUNITY)
Admission: RE | Disposition: A | Discharge status: Home / Self Care | Payer: Self-pay | Source: Ambulatory Visit | Attending: Obstetrics & Gynecology

## 2012-08-07 ENCOUNTER — Encounter (HOSPITAL_COMMUNITY): Payer: Self-pay | Admitting: Anesthesiology

## 2012-08-07 ENCOUNTER — Ambulatory Visit (HOSPITAL_COMMUNITY)
Admission: RE | Admit: 2012-08-07 | Discharge: 2012-08-07 | Disposition: A | Discharge status: Home / Self Care | Payer: Medicare Other | Source: Ambulatory Visit | Attending: Obstetrics & Gynecology | Admitting: Obstetrics & Gynecology

## 2012-08-07 ENCOUNTER — Ambulatory Visit (HOSPITAL_COMMUNITY): Payer: Medicare Other | Admitting: Anesthesiology

## 2012-08-07 DIAGNOSIS — N803 Endometriosis of pelvic peritoneum, unspecified: Secondary | ICD-10-CM | POA: Insufficient documentation

## 2012-08-07 DIAGNOSIS — N801 Endometriosis of ovary: Secondary | ICD-10-CM | POA: Insufficient documentation

## 2012-08-07 DIAGNOSIS — N9489 Other specified conditions associated with female genital organs and menstrual cycle: Secondary | ICD-10-CM | POA: Insufficient documentation

## 2012-08-07 DIAGNOSIS — I10 Essential (primary) hypertension: Secondary | ICD-10-CM | POA: Insufficient documentation

## 2012-08-07 DIAGNOSIS — N83209 Unspecified ovarian cyst, unspecified side: Secondary | ICD-10-CM

## 2012-08-07 DIAGNOSIS — N80109 Endometriosis of ovary, unspecified side, unspecified depth: Secondary | ICD-10-CM | POA: Insufficient documentation

## 2012-08-07 HISTORY — PX: LAPAROSCOPY: SHX197

## 2012-08-07 SURGERY — LAPAROSCOPY OPERATIVE
Anesthesia: General | Site: Abdomen | Laterality: Right | Wound class: Clean Contaminated

## 2012-08-07 MED ORDER — PROPOFOL 10 MG/ML IV BOLUS
INTRAVENOUS | Status: DC | PRN
  Administered 2012-08-07: 150 mg via INTRAVENOUS

## 2012-08-07 MED ORDER — ONDANSETRON HCL 4 MG/2ML IJ SOLN: 4.0000 mg | Freq: Once | INTRAMUSCULAR | Status: DC | PRN

## 2012-08-07 MED ORDER — NEOSTIGMINE METHYLSULFATE 1 MG/ML IJ SOLN
INTRAMUSCULAR | Status: DC | PRN
  Administered 2012-08-07: 3 mg via INTRAVENOUS

## 2012-08-07 MED ORDER — BUPIVACAINE HCL (PF) 0.25 % IJ SOLN
INTRAMUSCULAR | Status: AC
  Filled 2012-08-07: qty 30

## 2012-08-07 MED ORDER — FENTANYL CITRATE 0.05 MG/ML IJ SOLN
INTRAMUSCULAR | Status: AC
  Filled 2012-08-07: qty 5

## 2012-08-07 MED ORDER — GLYCOPYRROLATE 0.2 MG/ML IJ SOLN
INTRAMUSCULAR | Status: AC
  Filled 2012-08-07: qty 3

## 2012-08-07 MED ORDER — SCOPOLAMINE 1 MG/3DAYS TD PT72
MEDICATED_PATCH | TRANSDERMAL | Status: AC
  Administered 2012-08-07: 1.5 mg
  Filled 2012-08-07: qty 1

## 2012-08-07 MED ORDER — ONDANSETRON HCL 4 MG/2ML IJ SOLN
INTRAMUSCULAR | Status: AC
  Filled 2012-08-07: qty 2

## 2012-08-07 MED ORDER — ONDANSETRON HCL 4 MG/2ML IJ SOLN
INTRAMUSCULAR | Status: DC | PRN
  Administered 2012-08-07: 4 mg via INTRAVENOUS

## 2012-08-07 MED ORDER — ROCURONIUM BROMIDE 100 MG/10ML IV SOLN
INTRAVENOUS | Status: DC | PRN
  Administered 2012-08-07: 35 mg via INTRAVENOUS
  Administered 2012-08-07: 10 mg via INTRAVENOUS
  Administered 2012-08-07: 5 mg via INTRAVENOUS

## 2012-08-07 MED ORDER — MIDAZOLAM HCL 2 MG/2ML IJ SOLN: 0.5000 mg | INTRAMUSCULAR | Status: DC | PRN

## 2012-08-07 MED ORDER — LIDOCAINE HCL (CARDIAC) 20 MG/ML IV SOLN
INTRAVENOUS | Status: AC
  Filled 2012-08-07: qty 5

## 2012-08-07 MED ORDER — EPHEDRINE SULFATE 50 MG/ML IJ SOLN
INTRAMUSCULAR | Status: DC | PRN
  Administered 2012-08-07 (×3): 5 mg via INTRAVENOUS

## 2012-08-07 MED ORDER — KETOROLAC TROMETHAMINE 30 MG/ML IJ SOLN
INTRAMUSCULAR | Status: DC | PRN
  Administered 2012-08-07: 30 mg via INTRAVENOUS

## 2012-08-07 MED ORDER — METHYLENE BLUE 1 % INJ SOLN
INTRAMUSCULAR | Status: AC
  Filled 2012-08-07: qty 1

## 2012-08-07 MED ORDER — OXYCODONE-ACETAMINOPHEN 5-325 MG PO TABS: 1.0000 | ORAL_TABLET | Freq: Four times a day (QID) | ORAL | Status: DC | PRN

## 2012-08-07 MED ORDER — MIDAZOLAM HCL 5 MG/5ML IJ SOLN
INTRAMUSCULAR | Status: DC | PRN
  Administered 2012-08-07: 2 mg via INTRAVENOUS

## 2012-08-07 MED ORDER — MEPERIDINE HCL 25 MG/ML IJ SOLN: 6.2500 mg | INTRAMUSCULAR | Status: DC | PRN

## 2012-08-07 MED ORDER — HEPARIN SODIUM (PORCINE) 5000 UNIT/ML IJ SOLN
INTRAMUSCULAR | Status: DC | PRN
  Administered 2012-08-07: 5000 [IU] via SUBCUTANEOUS

## 2012-08-07 MED ORDER — KETOROLAC TROMETHAMINE 30 MG/ML IJ SOLN: 15.0000 mg | Freq: Once | INTRAMUSCULAR | Status: DC | PRN

## 2012-08-07 MED ORDER — BUPIVACAINE HCL (PF) 0.25 % IJ SOLN
INTRAMUSCULAR | Status: DC | PRN
  Administered 2012-08-07: 8 mL
  Administered 2012-08-07: 3 mL
  Administered 2012-08-07: 7 mL

## 2012-08-07 MED ORDER — LACTATED RINGERS IR SOLN
Status: DC | PRN
  Administered 2012-08-07: 3000 mL

## 2012-08-07 MED ORDER — DEXAMETHASONE SODIUM PHOSPHATE 4 MG/ML IJ SOLN
INTRAMUSCULAR | Status: DC | PRN
  Administered 2012-08-07: 4 mg via INTRAVENOUS

## 2012-08-07 MED ORDER — DEXAMETHASONE SODIUM PHOSPHATE 10 MG/ML IJ SOLN
INTRAMUSCULAR | Status: AC
  Filled 2012-08-07: qty 1

## 2012-08-07 MED ORDER — FENTANYL CITRATE 0.05 MG/ML IJ SOLN: 25.0000 ug | INTRAMUSCULAR | Status: DC | PRN

## 2012-08-07 MED ORDER — KETOROLAC TROMETHAMINE 30 MG/ML IJ SOLN
INTRAMUSCULAR | Status: AC
  Filled 2012-08-07: qty 1

## 2012-08-07 MED ORDER — ROCURONIUM BROMIDE 50 MG/5ML IV SOLN
INTRAVENOUS | Status: AC
  Filled 2012-08-07: qty 1

## 2012-08-07 MED ORDER — MIDAZOLAM HCL 2 MG/2ML IJ SOLN
INTRAMUSCULAR | Status: AC
  Filled 2012-08-07: qty 2

## 2012-08-07 MED ORDER — GLYCOPYRROLATE 0.2 MG/ML IJ SOLN
INTRAMUSCULAR | Status: DC | PRN
  Administered 2012-08-07: 0.6 mg via INTRAVENOUS

## 2012-08-07 MED ORDER — NEOSTIGMINE METHYLSULFATE 1 MG/ML IJ SOLN
INTRAMUSCULAR | Status: AC
  Filled 2012-08-07: qty 1

## 2012-08-07 MED ORDER — MIDAZOLAM HCL 2 MG/2ML IJ SOLN
INTRAMUSCULAR | Status: AC
  Administered 2012-08-07: 0.5 mg via INTRAVENOUS
  Filled 2012-08-07: qty 2

## 2012-08-07 MED ORDER — LIDOCAINE HCL (CARDIAC) 20 MG/ML IV SOLN
INTRAVENOUS | Status: DC | PRN
  Administered 2012-08-07: 60 mg via INTRAVENOUS

## 2012-08-07 MED ORDER — FENTANYL CITRATE 0.05 MG/ML IJ SOLN
INTRAMUSCULAR | Status: DC | PRN
  Administered 2012-08-07 (×4): 50 ug via INTRAVENOUS

## 2012-08-07 MED ORDER — LACTATED RINGERS IV SOLN
INTRAVENOUS | Status: DC
  Administered 2012-08-07 (×3): via INTRAVENOUS

## 2012-08-07 MED ORDER — PROPOFOL 10 MG/ML IV EMUL
INTRAVENOUS | Status: AC
  Filled 2012-08-07: qty 20

## 2012-08-07 SURGICAL SUPPLY — 30 items
CABLE HIGH FREQUENCY MONO STRZ (ELECTRODE) ×3 IMPLANT
CATH ROBINSON RED A/P 16FR (CATHETERS) IMPLANT
CHLORAPREP W/TINT 26ML (MISCELLANEOUS) ×3 IMPLANT
CLOTH BEACON ORANGE TIMEOUT ST (SAFETY) ×3 IMPLANT
DERMABOND ADVANCED (GAUZE/BANDAGES/DRESSINGS) ×1
DERMABOND ADVANCED .7 DNX12 (GAUZE/BANDAGES/DRESSINGS) ×2 IMPLANT
EVACUATOR SMOKE 8.L (FILTER) IMPLANT
FORCEPS CUTTING 33CM 5MM (CUTTING FORCEPS) ×3 IMPLANT
FORCEPS CUTTING 45CM 5MM (CUTTING FORCEPS) IMPLANT
GLOVE BIO SURGEON STRL SZ7 (GLOVE) ×3 IMPLANT
GLOVE BIOGEL PI IND STRL 7.0 (GLOVE) ×2 IMPLANT
GLOVE BIOGEL PI INDICATOR 7.0 (GLOVE) ×1
GOWN PREVENTION PLUS LG XLONG (DISPOSABLE) ×9 IMPLANT
MANIPULATOR UTERINE 4.5 ZUMI (MISCELLANEOUS) ×3 IMPLANT
NEEDLE INSUFFLATION 120MM (ENDOMECHANICALS) IMPLANT
NS IRRIG 1000ML POUR BTL (IV SOLUTION) ×3 IMPLANT
PACK LAPAROSCOPY BASIN (CUSTOM PROCEDURE TRAY) ×3 IMPLANT
POUCH SPECIMEN RETRIEVAL 10MM (ENDOMECHANICALS) ×3 IMPLANT
PROTECTOR NERVE ULNAR (MISCELLANEOUS) ×3 IMPLANT
SCISSORS LAP 5X35 DISP (ENDOMECHANICALS) ×3 IMPLANT
SET IRRIG TUBING LAPAROSCOPIC (IRRIGATION / IRRIGATOR) ×3 IMPLANT
SOLUTION ELECTROLUBE (MISCELLANEOUS) IMPLANT
SUT VICRYL 0 UR6 27IN ABS (SUTURE) ×3 IMPLANT
SUT VICRYL 4-0 PS2 18IN ABS (SUTURE) ×3 IMPLANT
TOWEL OR 17X24 6PK STRL BLUE (TOWEL DISPOSABLE) ×6 IMPLANT
TROCAR BALLN 12MMX100 BLUNT (TROCAR) ×3 IMPLANT
TROCAR XCEL NON-BLD 11X100MML (ENDOMECHANICALS) IMPLANT
TROCAR XCEL NON-BLD 5MMX100MML (ENDOMECHANICALS) ×6 IMPLANT
WARMER LAPAROSCOPE (MISCELLANEOUS) ×3 IMPLANT
WATER STERILE IRR 1000ML POUR (IV SOLUTION) IMPLANT

## 2012-08-07 NOTE — Anesthesia Postprocedure Evaluation (Signed)
Anesthesia Post Note  Patient: Nicole [name]  Procedure(s) Performed: Procedure(s) (LRB): LAPAROSCOPY OPERATIVE (Right)  Anesthesia type: General  Patient location: PACU  Post pain: Pain level controlled  Post assessment: Post-op Vital signs reviewed  Last Vitals:  Filed Vitals:   08/07/12 1800  BP: 123/58  Pulse: 80  Temp:   Resp: 16    Post vital signs: Reviewed  Level of consciousness: sedated  Complications: No apparent anesthesia complications

## 2012-08-07 NOTE — Anesthesia Preprocedure Evaluation (Signed)
Anesthesia Evaluation  Patient identified by MRN, date of birth, ID band Patient awake    Reviewed: Allergy & Precautions, H&P , NPO status , Patient's Chart, lab work & pertinent test results  Airway Mallampati: II TM Distance: >3 FB Neck ROM: full    Dental no notable dental hx.    Pulmonary neg pulmonary ROS,    Pulmonary exam normal       Cardiovascular hypertension, Pt. on medications     Neuro/Psych PSYCHIATRIC DISORDERS Anxiety negative neurological ROS     GI/Hepatic negative GI ROS, Neg liver ROS,   Endo/Other  negative endocrine ROS  Renal/GU negative Renal ROS  negative genitourinary   Musculoskeletal negative musculoskeletal ROS (+)   Abdominal Normal abdominal exam  (+)   Peds negative pediatric ROS (+)  Hematology negative hematology ROS (+)   Anesthesia Other Findings   Reproductive/Obstetrics                           Anesthesia Physical Anesthesia Plan  ASA: II  Anesthesia Plan: General   Post-op Pain Management:    Induction: Intravenous  Airway Management Planned: Oral ETT  Additional Equipment:   Intra-op Plan:   Post-operative Plan:   Informed Consent: I have reviewed the patients History and Physical, chart, labs and discussed the procedure including the risks, benefits and alternatives for the proposed anesthesia with the patient or authorized representative who has indicated his/her understanding and acceptance.     Plan Discussed with: CRNA and Surgeon  Anesthesia Plan Comments:         Anesthesia Quick Evaluation

## 2012-08-07 NOTE — Op Note (Signed)
Preoperative diagnosis: Persistent left adnexal mass Postop diagnosis: Left ovarian endometrioma, pelvic endometriosis Procedure: Laparoscopic left ovarian cystectomy, resection of endometrioma, pelvic washings Anesthesia Gen. Endotracheal Surgeon: Dr. Shea Evans Assistant: Dr Olivia Mackie IV fluids: 1600 cc LR EBL: minimal  Urine output: 200 cc, clear Complications: none Pathology: Left ovarian endometrioma cyst wall and peritoneal implant. Pelvic washings Disposition: PACU, stable, then home.   Findings: Left adnexal cyst was old endometrioma, normal fallopian tubes, neither ovary seen well. Pelvic adhesions, colon adhesions to posterior uterine wall in cul de sac. Ureters not seen well due to lateral wall adhesions. Normal uterus and ovaries and fallopian tubes. Normal appearing liver surface. Normal gross appearance of bowel and omentum  Procedure:  Indication: -- 67 yo menopausal woman with persistent left adnexal simple cyst without pain. Patient desires removal rather than continuing serial follow up pelvic ultrasounds. CA125 was normal.  Complications of surgery including infection, bleeding, damage to internal organs and other surgery related problems including pneumonia, VTE reviewed and informed written consent was obtained. Patient was brought to the operating room with IV running. No antibiotic prophylaxis indicated. She underwent general anesthesia without difficulty and was given dorsal lithotomy position, prepped and draped in sterile fashion. Foley catheter was placed. Cervix was exposed with a speculum and anterior lip of the cervix was grasped with tenaculum and a Hulka manipulator was introduced. Speculum, tenaculum removed.  Attention was focused on abdomen. Supraumbilical 12 mm transverse incision made with scalpel on upper side of the umbilicus after injecting Marcaine, fascia dissected, grasped with Kocher's and incised, and peritoneal entry was noted, was extended.  Palpation through the incision didn't reveal any adhesions. Purse string stay suture with 0-Vicryl taken on fascia and Hassan cannula introduced and Vicryl sutures secured on the Hassan cannula. Pneumoperitoneum was begun. Laparoscope was introduced and the peritoneal cavity was evaluated. Trendelenburg position given. Left adnexal cystic mass was seen that was consistent with endometrioma and there was evidence of endometriosis with old brown implants on left lateral wall as well in left paracolic gutter and cul de sac. Sigmoid colon was adherent to the left lateral wall making it difficult to see the ureter. Right ureter was not seen due to right lateral wall old scar tissue.  Two 5 mm incisions made in lower quadrants after marcaine injection and 5 mm trocar introduced under vision. Irrigation of pelvis done and pelvic washings obtained. Decision made to proceed with ovarian cystectomy rather that the proposed surgery to minimize bowel perforation/ injury and ureteral injury.  Ovarian cyst drained large amount of chocolate fluid, was drained and cavity inspected. Ovarian cyst was removal performed and sent to pathology. Irrigation performed. Right wall endometriosis implant removed and sent to path. Irrigation of pelvis and abdomen done, hemostasis excellent. No complications.  All instruments removed including trocars. Laparoscope and central port removed under vision. The stay sutures at the fascia tied together with excellent fascial closure. Skin approximated with subcuticular stitches on 4-0 Vicryl. Dermabond was applied.  All instruments/lap/sponges counts were correct x2.  No complications. Patient tolerated procedure well and was reversed from anesthesia and brought to the PACU stable condition.  Dr Juliene Pina was the surgeon for entire case.

## 2012-08-07 NOTE — Preoperative (Signed)
Beta Blockers   Reason not to administer Beta Blockers:Not Applicable 

## 2012-08-07 NOTE — Transfer of Care (Signed)
Immediate Anesthesia Transfer of Care Note  Patient: Nicole [name]  Procedure(s) Performed: Procedure(s) with comments: LAPAROSCOPY OPERATIVE (N/A) - 2 hrs. SALPINGO OOPHORECTOMY (Bilateral)  Patient Location: PACU  Anesthesia Type:General  Level of Consciousness: awake, alert , oriented and patient cooperative  Airway & Oxygen Therapy: Patient Spontanous Breathing and Patient connected to nasal cannula oxygen  Post-op Assessment: Report given to PACU RN and Post -op Vital signs reviewed and stable  Post vital signs: Reviewed and stable  Complications: No apparent anesthesia complications

## 2012-08-07 NOTE — H&P (Signed)
Nicole [name] is an 67 y.o. female G1P1, menopausal, no HRT. Here for surgery- BSO for persistent left adnexal cystic mass noted on CT in Mar'13, was 3.8 cm since largest diameter then and has increased slightly to 4.4 cm per office sono last months. No solid areas or abn vessels, no free fluid, nl Ca125. But due to increase in size plan to remove the mass rather than following with imaging. Patient wants both ovaries removed since we are proceeding with LSO.  Denies any pain or complaints.  Nl Paps, no bleeding. No breast complaints, nl mammograms.    Past Medical History  Diagnosis Date  . Allergic rhinitis   . Hx of colonic polyps   . Hematuria     asymptomatic  . Dislocated shoulder     right   . Hypertension   . Flying phobia     General anxieety also-xanax prn    Past Surgical History  Procedure Laterality Date  . Hernia repair  1999    Dr. Noni Saupe    Family History  Problem Relation Age of Onset  . Hypertension Mother   . Osteoporosis Mother   . Lymphoma Father     Social History:  reports that she has never smoked. She does not have any smokeless tobacco history on file. She reports that  drinks alcohol. Her drug history is not on file.  Allergies:  Allergies  Allergen Reactions  . Oysters (Shellfish Allergy) Swelling    Swelling of lips next day after eating oysters  . Penicillins Hives  . Sulfamethoxazole W-Trimethoprim Other (See Comments)    REACTION: turned firey red    No prescriptions prior to admission    Review of Systems  Constitutional: Negative for fever.  Eyes: Negative for blurred vision.  Respiratory: Negative for cough.   Cardiovascular: Negative for chest pain.  Gastrointestinal: Negative for heartburn.  Genitourinary: Negative for dysuria.  Musculoskeletal: Negative for myalgias.  Neurological: Negative for dizziness and headaches.    There were no vitals taken for this visit. Physical Exam Physical exam:  A&O x 3,  no acute distress. Pleasant HEENT neg, no thyromegaly Lungs CTA bilat CV RRR, S1S2 normal Abdo soft, non tender, non acute Extr no edema/ tenderness Pelvic no masses felt, nl uterus and adnexa.  CT report and serial sono reports reviewed.    Assessment/Plan: 67 yo menopausal woman with persistent left adnexal cystic mass, plan removal with L.scopic BSO and pelvic washings today. NO indication for hysterectomy. CA125 nl/   Risks/complications of surgery reviewed incl infection, bleeding, damage to internal organs including bladder, bowels, ureters, blood vessels, other risks from anesthesia, VTE and delayed complications of any surgery, complications in future surgery reviewed. Understands and consents/    Michal Callicott R 08/07/2012, 8:08 AM

## 2012-08-08 ENCOUNTER — Encounter (HOSPITAL_COMMUNITY): Payer: Self-pay | Admitting: Obstetrics & Gynecology

## 2012-08-09 MED FILL — Heparin Sodium (Porcine) Inj 5000 Unit/ML: INTRAMUSCULAR | Qty: 1 | Status: AC

## 2012-09-12 ENCOUNTER — Other Ambulatory Visit: Payer: Self-pay | Admitting: Family Medicine

## 2012-11-19 ENCOUNTER — Telehealth: Payer: Self-pay | Admitting: Family Medicine

## 2012-11-19 NOTE — Telephone Encounter (Signed)
Pt would like to know if she could bring her urine in early for her cpx labs? Pt states she has "shy bladder" and cannot go like that in other places. Pt will p/u container if she can. pls advise. Ok to leave message.

## 2012-11-19 NOTE — Telephone Encounter (Signed)
No problem she can pick up a cup from the lab

## 2012-11-22 ENCOUNTER — Encounter (HOSPITAL_COMMUNITY): Payer: Self-pay | Admitting: Emergency Medicine

## 2012-11-22 ENCOUNTER — Emergency Department (HOSPITAL_COMMUNITY)
Admission: EM | Admit: 2012-11-22 | Discharge: 2012-11-22 | Disposition: A | Discharge status: Home / Self Care | Payer: Medicare Other | Attending: Emergency Medicine | Admitting: Emergency Medicine

## 2012-11-22 DIAGNOSIS — F411 Generalized anxiety disorder: Secondary | ICD-10-CM | POA: Insufficient documentation

## 2012-11-22 DIAGNOSIS — Z8781 Personal history of (healed) traumatic fracture: Secondary | ICD-10-CM | POA: Insufficient documentation

## 2012-11-22 DIAGNOSIS — Z860100 Personal history of colon polyps, unspecified: Secondary | ICD-10-CM | POA: Insufficient documentation

## 2012-11-22 DIAGNOSIS — Z79899 Other long term (current) drug therapy: Secondary | ICD-10-CM | POA: Insufficient documentation

## 2012-11-22 DIAGNOSIS — R42 Dizziness and giddiness: Secondary | ICD-10-CM

## 2012-11-22 DIAGNOSIS — Z7982 Long term (current) use of aspirin: Secondary | ICD-10-CM | POA: Insufficient documentation

## 2012-11-22 DIAGNOSIS — Z880 Allergy status to penicillin: Secondary | ICD-10-CM | POA: Insufficient documentation

## 2012-11-22 DIAGNOSIS — Z87891 Personal history of nicotine dependence: Secondary | ICD-10-CM | POA: Insufficient documentation

## 2012-11-22 DIAGNOSIS — I10 Essential (primary) hypertension: Secondary | ICD-10-CM | POA: Insufficient documentation

## 2012-11-22 LAB — BASIC METABOLIC PANEL
BUN: 15 mg/dL (ref 6–23)
CO2: 26 mEq/L (ref 19–32)
Chloride: 94 mEq/L — ABNORMAL LOW (ref 96–112)
Creatinine, Ser: 0.85 mg/dL (ref 0.50–1.10)
GFR calc Af Amer: 80 mL/min — ABNORMAL LOW (ref >90)
Glucose, Bld: 124 mg/dL — ABNORMAL HIGH (ref 70–99)
Sodium: 131 mEq/L — ABNORMAL LOW (ref 135–145)

## 2012-11-22 LAB — CBC WITH DIFFERENTIAL/PLATELET
Basophils Absolute: 0.1 10*3/uL (ref 0.0–0.1)
Eosinophils Relative: 1 % (ref 0–5)
HCT: 37.1 % (ref 36.0–46.0)
Lymphocytes Relative: 24 % (ref 12–46)
MCV: 89.6 fL (ref 78.0–100.0)
Monocytes Absolute: 0.7 10*3/uL (ref 0.1–1.0)
Platelets: 297 10*3/uL (ref 150–400)
RDW: 13.8 % (ref 11.5–15.5)
WBC: 12.1 10*3/uL — ABNORMAL HIGH (ref 4.0–10.5)

## 2012-11-22 MED ORDER — PROMETHAZINE HCL 25 MG/ML IJ SOLN
25.0000 mg | Freq: Once | INTRAMUSCULAR | Status: AC
  Administered 2012-11-22: 25 mg via INTRAVENOUS
  Filled 2012-11-22: qty 1

## 2012-11-22 MED ORDER — SODIUM CHLORIDE 0.9 % IV BOLUS (SEPSIS)
1000.0000 mL | Freq: Once | INTRAVENOUS | Status: AC
  Administered 2012-11-22: 1000 mL via INTRAVENOUS

## 2012-11-22 MED ORDER — DIPHENHYDRAMINE HCL 50 MG/ML IJ SOLN
25.0000 mg | Freq: Once | INTRAMUSCULAR | Status: AC
  Administered 2012-11-22: 25 mg via INTRAVENOUS
  Filled 2012-11-22: qty 1

## 2012-11-22 MED ORDER — MECLIZINE HCL 25 MG PO TABS: 25.0000 mg | ORAL_TABLET | Freq: Three times a day (TID) | ORAL | Status: DC | PRN

## 2012-11-22 NOTE — ED Notes (Signed)
Pt c/o emesis x 1 hour. Multiple episodes. Per husband pt c/o dizziness then began vomiting.

## 2012-11-25 NOTE — Telephone Encounter (Signed)
Pt aware.

## 2012-11-27 ENCOUNTER — Other Ambulatory Visit: Payer: Self-pay | Admitting: Family Medicine

## 2012-11-27 NOTE — ED Provider Notes (Signed)
CSN: [id number]     Arrival date & time 11/22/12  1929 History   First MD Initiated Contact with Patient 11/22/12 2016     Chief Complaint  Patient presents with  . Emesis   (Consider location/radiation/quality/duration/timing/severity/associated sxs/prior Treatment) HPI  67 year old female with vertigo. Onset shortly before arrival. Patient describes of things spinning violently around her. First noticed as she was getting out of the car. Improved with rest and then resumed when she  started walking. Nausea and multiple episodes of vomiting. Denies any pain. No tinnitus or sensation of ear fullness. No fevers or chills. No visual complaints. No numbness, tingling or loss of strength. No prior history of vertigo. No recent trauma.  Past Medical History  Diagnosis Date  . Allergic rhinitis   . Hx of colonic polyps   . Hematuria     asymptomatic  . Dislocated shoulder     right   . Hypertension   . Flying phobia     General anxieety also-xanax prn   Past Surgical History  Procedure Laterality Date  . Hernia repair  1999    Dr. Noni Saupe  . Laparoscopy Right 08/07/2012    Procedure: LAPAROSCOPY OPERATIVE;  Surgeon: Robley Fries, MD;  Location: [facility];  Service: Gynecology;  Laterality: Right;  Removal Right Ovarian Cyst Wall, Pelvic Washings   Family History  Problem Relation Age of Onset  . Hypertension Mother   . Osteoporosis Mother   . Lymphoma Father    History  Substance Use Topics  . Smoking status: Former Games developer  . Smokeless tobacco: Not on file  . Alcohol Use: Yes     Comment: Occasionnally-social   OB History   Grav Para Term Preterm Abortions TAB SAB Ect Mult Living                 Review of Systems  All systems reviewed and negative, other than as noted in HPI.   Allergies  Oysters; Penicillins; and Sulfamethoxazole-trimethoprim  Home Medications   Current Outpatient Rx  Name  Route  Sig  Dispense  Refill  . ALPRAZolam (XANAX) 0.25  MG tablet   Oral   Take 0.25 mg by mouth at bedtime as needed for sleep or anxiety.         Marland Kitchen aspirin 81 MG tablet   Oral   Take 81 mg by mouth daily.          Marland Kitchen lisinopril-hydrochlorothiazide (PRINZIDE,ZESTORETIC) 20-25 MG per tablet      TAKE 1 TABLET BY MOUTH DAILY.   100 tablet   2   . Multiple Vitamin (MULTIVITAMIN WITH MINERALS) TABS   Oral   Take 1 tablet by mouth daily.         . simvastatin (ZOCOR) 20 MG tablet   Oral   Take 10 mg by mouth 3 (three) times a week. Mondays, wednesdays, & saturdays         . meclizine (ANTIVERT) 25 MG tablet   Oral   Take 1 tablet (25 mg total) by mouth 3 (three) times daily as needed for dizziness.   30 tablet   0    BP 105/78  Pulse 82  Temp(Src) 98 F (36.7 C) (Oral)  Resp 14  Ht 5\' 4"  (1.626 m)  Wt 149 lb (67.586 kg)  BMI 25.56 kg/m2  SpO2 98% Physical Exam  Nursing note and vitals reviewed. Constitutional: She is oriented to person, place, and time. She appears well-developed and well-nourished. No distress.  HENT:  Head: Normocephalic and atraumatic.  Right Ear: External ear normal.  Left Ear: External ear normal.  TMs clear bilaterally  Eyes: Conjunctivae are normal. Right eye exhibits no discharge. Left eye exhibits no discharge.  Neck: Normal range of motion. Neck supple.  Cardiovascular: Normal rate, regular rhythm and normal heart sounds.  Exam reveals no gallop and no friction rub.   No murmur heard. Pulmonary/Chest: Effort normal and breath sounds normal. No respiratory distress.  Abdominal: Soft. She exhibits no distension. There is no tenderness.  Musculoskeletal: She exhibits no edema and no tenderness.  Neurological: She is alert and oriented to person, place, and time. No cranial nerve deficit. She exhibits normal muscle tone. Coordination normal.  Good finger to nose testing bilaterally. Gait is steady.  Skin: Skin is warm and dry. She is not diaphoretic.  Psychiatric: She has a normal mood and  affect. Her behavior is normal. Thought content normal.    ED Course  Procedures (including critical care time) Labs Review Labs Reviewed  CBC WITH DIFFERENTIAL - Abnormal; Notable for the following:    WBC 12.1 (*)    Neutro Abs 8.2 (*)    All other components within normal limits  BASIC METABOLIC PANEL - Abnormal; Notable for the following:    Sodium 131 (*)    Potassium 3.4 (*)    Chloride 94 (*)    Glucose, Bld 124 (*)    GFR calc non Af Amer 69 (*)    GFR calc Af Amer 80 (*)    All other components within normal limits  LIPASE, BLOOD   Imaging Review No results found.  EKG Interpretation     Ventricular Rate:  76 PR Interval:  157 QRS Duration: 87 QT Interval:  387 QTC Calculation: 435 R Axis:   14 Text Interpretation:  Sinus rhythm Poor R wave progression Non-specific ST-t changes            MDM   1. Vertigo    67 year old female with vertigo. Suspect peripheral. Nonfocal neurological examination. Hemodynamically stable. Symptoms improved with symptomatic treatment. We'll discharge the prescription for meclizine as needed. Return precautions for discussed. Outpatient followup otherwise.    Raeford Razor, MD 11/27/12 [phone]

## 2012-12-08 ENCOUNTER — Telehealth: Payer: Self-pay | Admitting: Family Medicine

## 2012-12-08 NOTE — Telephone Encounter (Signed)
Per Dr Tawanna Cooler patient should try Afrin and gum. patient is aware.

## 2012-12-08 NOTE — Telephone Encounter (Signed)
Patient Information:  Caller Name: Nicole [name]  Phone: [phone]  Patient: Nicole, [name]  Gender: Female  DOB: 02/18/1945  Age: 67 Years  PCP: Kelle Darting [facility])  Office Follow Up:  Does the office need to follow up with this patient?: Yes  Instructions For The Office: Please follow up with patient regarding need to take Meclizine prior to flying due to episode of 2 wks ago and if so should she also take the Xanax she usually does prior to flying.  She states just leaving her a message would be fine.  RN Note:  Patient calls to inquire about any preventive things she need to do before flying in December and January.  About 2 weeks ago had an episode of vertigo after returning to from the beach going to ED.  Dx with classic vertigo, but given Meclizine 25 mg in case it happens again.  She is a little concerned about flying had a vague episode once after getting off the plane some time ago.  She wants to know if she should take the meclizine prior to flying with that recent episode and if so should she or should she not take a Xanax that she usually take for nerves when flying.  She has been symptom free since that episode 2 weeks ago.  Please follow up with patient when possible.  She states leaving a message would be fine.  Clinical Profile reviewed orally.  Symptoms  Reason For Call & Symptoms: 2 weeks ago had Vertigo and went to ED.  It was classic case.  Bonine was given in case it returned  Reviewed Health History In EMR: Yes  Reviewed Medications In EMR: Yes  Reviewed Allergies In EMR: Yes  Reviewed Surgeries / Procedures: Yes  Date of Onset of Symptoms: 12/08/2012  Treatments Tried: Meclizine 25 mg  Treatments Tried Worked: Yes  Guideline(s) Used:  No Protocol Available - Information Only  Disposition Per Guideline:   Discuss with PCP and Callback by Nurse Today  Reason For Disposition Reached:   Nursing judgment  Advice Given:  Call Back If:  New symptoms  develop  Patient Will Follow Care Advice:  YES

## 2013-02-05 ENCOUNTER — Other Ambulatory Visit: Payer: Medicare Other

## 2013-02-09 ENCOUNTER — Encounter: Payer: Medicare Other | Admitting: Family Medicine

## 2013-03-24 ENCOUNTER — Other Ambulatory Visit: Payer: Self-pay | Admitting: Family Medicine

## 2013-03-30 ENCOUNTER — Other Ambulatory Visit: Payer: Self-pay | Admitting: Family Medicine

## 2013-04-02 ENCOUNTER — Other Ambulatory Visit: Payer: Self-pay | Admitting: Family Medicine

## 2013-04-22 ENCOUNTER — Other Ambulatory Visit (INDEPENDENT_AMBULATORY_CARE_PROVIDER_SITE_OTHER): Payer: Medicare Other

## 2013-04-22 DIAGNOSIS — Z0000 Encounter for general adult medical examination without abnormal findings: Secondary | ICD-10-CM

## 2013-04-22 DIAGNOSIS — E785 Hyperlipidemia, unspecified: Secondary | ICD-10-CM

## 2013-04-22 LAB — CBC WITH DIFFERENTIAL/PLATELET
BASOS PCT: 0.7 % (ref 0.0–3.0)
Basophils Absolute: 0.1 10*3/uL (ref 0.0–0.1)
EOS PCT: 0.8 % (ref 0.0–5.0)
Eosinophils Absolute: 0.1 10*3/uL (ref 0.0–0.7)
HEMATOCRIT: 36.2 % (ref 36.0–46.0)
HEMOGLOBIN: 12.1 g/dL (ref 12.0–15.0)
Lymphocytes Relative: 32.2 % (ref 12.0–46.0)
Lymphs Abs: 3.3 10*3/uL (ref 0.7–4.0)
MCHC: 33.5 g/dL (ref 30.0–36.0)
MCV: 90.8 fl (ref 78.0–100.0)
MONO ABS: 0.8 10*3/uL (ref 0.1–1.0)
Monocytes Relative: 8.3 % (ref 3.0–12.0)
NEUTROS ABS: 5.9 10*3/uL (ref 1.4–7.7)
Neutrophils Relative %: 58 % (ref 43.0–77.0)
PLATELETS: 305 10*3/uL (ref 150.0–400.0)
RBC: 3.98 Mil/uL (ref 3.87–5.11)
RDW: 13.9 % (ref 11.5–14.6)
WBC: 10.1 10*3/uL (ref 4.5–10.5)

## 2013-04-22 LAB — LIPID PANEL
Cholesterol: 179 mg/dL (ref 0–200)
HDL: 66.9 mg/dL (ref >39.00)
LDL Cholesterol: 92 mg/dL (ref 0–99)
Total CHOL/HDL Ratio: 3
Triglycerides: 103 mg/dL (ref 0.0–149.0)
VLDL: 20.6 mg/dL (ref 0.0–40.0)

## 2013-04-22 LAB — BASIC METABOLIC PANEL
BUN: 10 mg/dL (ref 6–23)
CHLORIDE: 94 meq/L — AB (ref 96–112)
CO2: 25 mEq/L (ref 19–32)
Calcium: 9.6 mg/dL (ref 8.4–10.5)
Creatinine, Ser: 0.9 mg/dL (ref 0.4–1.2)
GFR: 69.77 mL/min (ref >60.00)
Glucose, Bld: 83 mg/dL (ref 70–99)
POTASSIUM: 4.3 meq/L (ref 3.5–5.1)
SODIUM: 131 meq/L — AB (ref 135–145)

## 2013-04-22 LAB — HEPATIC FUNCTION PANEL
ALT: 18 U/L (ref 0–35)
AST: 24 U/L (ref 0–37)
Albumin: 4.4 g/dL (ref 3.5–5.2)
Alkaline Phosphatase: 54 U/L (ref 39–117)
BILIRUBIN TOTAL: 0.9 mg/dL (ref 0.3–1.2)
Bilirubin, Direct: 0 mg/dL (ref 0.0–0.3)
TOTAL PROTEIN: 7.3 g/dL (ref 6.0–8.3)

## 2013-04-22 LAB — TSH: TSH: 0.66 u[IU]/mL (ref 0.35–5.50)

## 2013-04-28 ENCOUNTER — Encounter: Payer: Self-pay | Admitting: Family Medicine

## 2013-04-28 ENCOUNTER — Ambulatory Visit (INDEPENDENT_AMBULATORY_CARE_PROVIDER_SITE_OTHER): Payer: Medicare Other | Admitting: Family Medicine

## 2013-04-28 VITALS — BP 132/86 | HR 105 | Temp 98.9°F | Ht 64.25 in | Wt 154.2 lb

## 2013-04-28 DIAGNOSIS — E785 Hyperlipidemia, unspecified: Secondary | ICD-10-CM

## 2013-04-28 DIAGNOSIS — Z0000 Encounter for general adult medical examination without abnormal findings: Secondary | ICD-10-CM

## 2013-04-28 DIAGNOSIS — IMO0002 Reserved for concepts with insufficient information to code with codable children: Secondary | ICD-10-CM

## 2013-04-28 DIAGNOSIS — G4700 Insomnia, unspecified: Secondary | ICD-10-CM | POA: Insufficient documentation

## 2013-04-28 DIAGNOSIS — D729 Disorder of white blood cells, unspecified: Secondary | ICD-10-CM

## 2013-04-28 MED ORDER — SIMVASTATIN 20 MG PO TABS: 10.0000 mg | ORAL_TABLET | ORAL | Status: DC

## 2013-04-28 MED ORDER — LORAZEPAM 0.5 MG PO TABS: ORAL_TABLET | ORAL | Status: DC

## 2013-04-28 MED ORDER — LISINOPRIL-HYDROCHLOROTHIAZIDE 20-25 MG PO TABS: ORAL_TABLET | ORAL | Status: DC

## 2013-04-28 NOTE — Progress Notes (Signed)
Pre visit review using our clinic review tool, if applicable. No additional management support is needed unless otherwise documented below in the visit note. 

## 2013-04-28 NOTE — Patient Instructions (Signed)
Stop the Xanax  Ativan 0.5....... one half to one tablet at bedtime when necessary for sleep  Walk daily  Continue your other medications  Followup in 1 year sooner if any problems

## 2013-04-28 NOTE — Progress Notes (Signed)
   Subjective:    Patient ID: Nicole [name], female    DOB: 1945/06/01, 68 y.o.   MRN: [id number]  HPI Nicole [name] is a 68 year old married female nonsmoker who comes in today for a Medicare wellness examination. She has a history of underlying hypertension on Zestoretic 20-12.5 daily BP today 130/80. She also has a history of hyperlipidemia and takes simvastatin 10 mg Monday Wednesday Friday lipids are at goal. She also takes an aspirin tablet  We originally given her 0.25 of Xanax to take for flying now she's taking a couple times a week for sleep. We discussed switching to something else  She was in emergent is some with a bout of vertigo diagnostic studies were all normal  She also had a laparoscopic procedure by her gynecologist thinking she had an enlarged ovary. It turned out to be an endometrioma.  She gets routine eye care, dental care, BSE monthly, and you mammography, colonoscopy or 5 years because of a family history of colon polyps. She herself has never had any polyps and up colonoscopies have all been normal.  Cognitive function normal she walks on a regular basis home health safety reviewed no issues identified, no guns in the house she does have a health care power of attorney and living will   Review of Systems  Constitutional: Negative.   HENT: Negative.   Eyes: Negative.   Respiratory: Negative.   Cardiovascular: Negative.   Gastrointestinal: Negative.   Genitourinary: Negative.   Musculoskeletal: Negative.   Neurological: Negative.   Psychiatric/Behavioral: Negative.        Objective:   Physical Exam  Nursing note and vitals reviewed. Constitutional: She appears well-developed and well-nourished.  HENT:  Head: Normocephalic and atraumatic.  Right Ear: External ear normal.  Left Ear: External ear normal.  Nose: Nose normal.  Mouth/Throat: Oropharynx is clear and moist.  Eyes: EOM are normal. Pupils are equal, round, and reactive to light.  Neck: Normal range  of motion. Neck supple. No thyromegaly present.  Cardiovascular: Normal rate, regular rhythm, normal heart sounds and intact distal pulses.  Exam reveals no gallop and no friction rub.   No murmur heard. No carotid or aortic bruits peripheral pulses 2+ and symmetrical  Pulmonary/Chest: Effort normal and breath sounds normal.  Abdominal: Soft. Bowel sounds are normal. She exhibits no distension and no mass. There is no tenderness. There is no rebound.  Genitourinary:  Bilateral breast exam normal. Recent pelvic exam by GYN normal therefore deferred  Musculoskeletal: Normal range of motion.  Lymphadenopathy:    She has no cervical adenopathy.  Neurological: She is alert. She has normal reflexes. No cranial nerve deficit. She exhibits normal muscle tone. Coordination normal.  Skin: Skin is warm and dry.  Total body skin exam because she's had a history of squamous cell carcinoma.  Her skin was normal. She has a garden-variety of freckles moles seborrheic keratosis and capillary hemangiomas however I do not appreciate anything abnormal.  Psychiatric: She has a normal mood and affect. Her behavior is normal. Judgment and thought content normal.          Assessment & Plan:  Healthy female  Hypertension at goal continue current medication  History of hyperlipidemia continue Zocor as outlined  Sleep dysfunction switch to Ativan 0.5 each bedtime when necessary

## 2013-04-29 ENCOUNTER — Telehealth: Payer: Self-pay | Admitting: Family Medicine

## 2013-04-29 NOTE — Telephone Encounter (Signed)
Relevant patient education assigned to patient using Emmi. ° °

## 2013-06-30 ENCOUNTER — Other Ambulatory Visit: Payer: Self-pay

## 2013-06-30 DIAGNOSIS — Z1231 Encounter for screening mammogram for malignant neoplasm of breast: Secondary | ICD-10-CM

## 2013-08-10 ENCOUNTER — Ambulatory Visit
Admission: RE | Admit: 2013-08-10 | Discharge: 2013-08-10 | Disposition: A | Discharge status: Home / Self Care | Payer: 59 | Source: Ambulatory Visit

## 2013-08-10 ENCOUNTER — Encounter (INDEPENDENT_AMBULATORY_CARE_PROVIDER_SITE_OTHER): Payer: Self-pay

## 2013-08-10 DIAGNOSIS — Z1231 Encounter for screening mammogram for malignant neoplasm of breast: Secondary | ICD-10-CM

## 2014-01-18 ENCOUNTER — Other Ambulatory Visit: Payer: Self-pay | Admitting: Family Medicine

## 2014-03-23 ENCOUNTER — Other Ambulatory Visit: Payer: Self-pay | Admitting: Dermatology

## 2014-04-08 ENCOUNTER — Other Ambulatory Visit: Payer: Self-pay | Admitting: Dermatology

## 2014-05-04 ENCOUNTER — Other Ambulatory Visit (INDEPENDENT_AMBULATORY_CARE_PROVIDER_SITE_OTHER): Payer: Medicare Other

## 2014-05-04 DIAGNOSIS — I10 Essential (primary) hypertension: Secondary | ICD-10-CM

## 2014-05-04 DIAGNOSIS — Z0000 Encounter for general adult medical examination without abnormal findings: Secondary | ICD-10-CM

## 2014-05-04 DIAGNOSIS — E785 Hyperlipidemia, unspecified: Secondary | ICD-10-CM | POA: Diagnosis not present

## 2014-05-04 DIAGNOSIS — Z79899 Other long term (current) drug therapy: Secondary | ICD-10-CM

## 2014-05-04 LAB — LIPID PANEL
CHOL/HDL RATIO: 2
Cholesterol: 172 mg/dL (ref 0–200)
HDL: 70.3 mg/dL (ref >39.00)
LDL CALC: 88 mg/dL (ref 0–99)
NONHDL: 101.7
TRIGLYCERIDES: 70 mg/dL (ref 0.0–149.0)
VLDL: 14 mg/dL (ref 0.0–40.0)

## 2014-05-04 LAB — CBC WITH DIFFERENTIAL/PLATELET
Basophils Absolute: 0.1 10*3/uL (ref 0.0–0.1)
Basophils Relative: 0.6 % (ref 0.0–3.0)
EOS PCT: 0.5 % (ref 0.0–5.0)
Eosinophils Absolute: 0 10*3/uL (ref 0.0–0.7)
HCT: 36.8 % (ref 36.0–46.0)
HEMOGLOBIN: 12.4 g/dL (ref 12.0–15.0)
Lymphocytes Relative: 29.6 % (ref 12.0–46.0)
Lymphs Abs: 2.8 10*3/uL (ref 0.7–4.0)
MCHC: 33.8 g/dL (ref 30.0–36.0)
MCV: 89.2 fl (ref 78.0–100.0)
MONOS PCT: 6.5 % (ref 3.0–12.0)
Monocytes Absolute: 0.6 10*3/uL (ref 0.1–1.0)
NEUTROS PCT: 62.8 % (ref 43.0–77.0)
Neutro Abs: 6 10*3/uL (ref 1.4–7.7)
PLATELETS: 329 10*3/uL (ref 150.0–400.0)
RBC: 4.13 Mil/uL (ref 3.87–5.11)
RDW: 13.7 % (ref 11.5–15.5)
WBC: 9.5 10*3/uL (ref 4.0–10.5)

## 2014-05-04 LAB — COMPREHENSIVE METABOLIC PANEL
ALT: 16 U/L (ref 0–35)
AST: 20 U/L (ref 0–37)
Albumin: 4.5 g/dL (ref 3.5–5.2)
Alkaline Phosphatase: 59 U/L (ref 39–117)
BILIRUBIN TOTAL: 0.7 mg/dL (ref 0.2–1.2)
BUN: 11 mg/dL (ref 6–23)
CHLORIDE: 94 meq/L — AB (ref 96–112)
CO2: 28 mEq/L (ref 19–32)
Calcium: 9.9 mg/dL (ref 8.4–10.5)
Creatinine, Ser: 0.86 mg/dL (ref 0.40–1.20)
GFR: 69.56 mL/min (ref >60.00)
GLUCOSE: 86 mg/dL (ref 70–99)
POTASSIUM: 4 meq/L (ref 3.5–5.1)
SODIUM: 130 meq/L — AB (ref 135–145)
Total Protein: 7.5 g/dL (ref 6.0–8.3)

## 2014-05-04 LAB — TSH: TSH: 1.41 u[IU]/mL (ref 0.35–4.50)

## 2014-05-11 ENCOUNTER — Encounter: Payer: Self-pay | Admitting: Family Medicine

## 2014-05-11 ENCOUNTER — Ambulatory Visit (INDEPENDENT_AMBULATORY_CARE_PROVIDER_SITE_OTHER): Payer: Medicare Other | Admitting: Family Medicine

## 2014-05-11 VITALS — BP 120/80 | Temp 98.7°F | Ht 63.5 in | Wt 151.0 lb

## 2014-05-11 DIAGNOSIS — Z0000 Encounter for general adult medical examination without abnormal findings: Secondary | ICD-10-CM | POA: Diagnosis not present

## 2014-05-11 DIAGNOSIS — Z9889 Other specified postprocedural states: Secondary | ICD-10-CM

## 2014-05-11 DIAGNOSIS — I10 Essential (primary) hypertension: Secondary | ICD-10-CM

## 2014-05-11 DIAGNOSIS — Z85820 Personal history of malignant melanoma of skin: Secondary | ICD-10-CM

## 2014-05-11 DIAGNOSIS — E785 Hyperlipidemia, unspecified: Secondary | ICD-10-CM | POA: Diagnosis not present

## 2014-05-11 MED ORDER — LISINOPRIL-HYDROCHLOROTHIAZIDE 20-25 MG PO TABS: ORAL_TABLET | ORAL | Status: DC

## 2014-05-11 MED ORDER — LORAZEPAM 0.5 MG PO TABS: ORAL_TABLET | ORAL | Status: DC

## 2014-05-11 MED ORDER — SIMVASTATIN 20 MG PO TABS: 10.0000 mg | ORAL_TABLET | ORAL | Status: DC

## 2014-05-11 NOTE — Progress Notes (Signed)
Pre visit review using our clinic review tool, if applicable. No additional management support is needed unless otherwise documented below in the visit note. 

## 2014-05-11 NOTE — Patient Instructions (Signed)
Continue current medications  Follow-up in one year for general physical examination......... 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

## 2014-05-11 NOTE — Progress Notes (Signed)
   Subjective:    Patient ID: Nicole [name], female    DOB: 1945-02-18, 69 y.o.   MRN: [id number]  HPI Nicole [name] is a 69 year old married female nonsmoker semiretired Radio producer who comes in today for general physical examination because of a history of hyperlipidemia and hypertension  She says she's had a good year except she went in for a dermatologic check on some skin tags around her I. Dr. 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 He excised it. Wide excision was negative.  She gets routine eye care, dental care, BSE monthly, annual mammography, colonoscopy 2011 normal  Last year she had a diagnostic laparoscopy by GYN. She was found to have a benign lesion in her uterus.  Vaccinations up-to-date except she's doing shingles vaccine. Advised to call her insurance company and let us know where she can get it done. She declines a Pneumovax today  Cognitive function normal she walks daily 3 miles, home health safety reviewed no issues identified, no guns in the house, she does have a healthcare power of attorney and living well  She takes Ativan 0.5 daily at bedtime for sleep, Zestoretic 20-25 daily for hypertension, and Zocor 10 mg Monday Wednesday Friday for hyperlipidemia..... Lipids are normal on that regime   Review of Systems  Constitutional: Negative.   HENT: Negative.   Eyes: Negative.   Respiratory: Negative.   Cardiovascular: Negative.   Gastrointestinal: Negative.   Endocrine: Negative.   Genitourinary: Negative.   Musculoskeletal: Negative.   Skin: Negative.   Allergic/Immunologic: Negative.   Neurological: Negative.   Hematological: Negative.   Psychiatric/Behavioral: Negative.        Objective:   Physical Exam  Constitutional: She is oriented to person, place, and time. She appears well-developed and well-nourished.  HENT:  Head: Normocephalic and atraumatic.  Right Ear: External ear normal.  Left Ear: External ear normal.  Nose: Nose normal.    Mouth/Throat: Oropharynx is clear and moist.  Eyes: EOM are normal. Pupils are equal, round, and reactive to light.  Neck: Normal range of motion. Neck supple. No JVD present. No tracheal deviation present. No thyromegaly present.  Cardiovascular: Normal rate, regular rhythm, normal heart sounds and intact distal pulses.  Exam reveals no gallop and no friction rub.   No murmur heard. Pulmonary/Chest: Effort normal and breath sounds normal. No stridor. No respiratory distress. She has no wheezes. She has no rales. She exhibits no tenderness.  Abdominal: Soft. Bowel sounds are normal. She exhibits no distension and no mass. There is no tenderness. There is no rebound and no guarding.  Genitourinary:  Pelvic and rectal by GYN therefore deferred  Bilateral breast exam normal  Musculoskeletal: Normal range of motion.  Lymphadenopathy:    She has no cervical adenopathy.  Neurological: She is alert and oriented to person, place, and time. She has normal reflexes. No cranial nerve deficit. She exhibits normal muscle tone. Coordination normal.  Skin: Skin is warm and dry. No rash noted. No erythema. No pallor.  Total body skin exam normal except for healing scar left upper quadrant from previous melanoma excision by Dr. Ronnald [name] her dermatologist.  Psychiatric: She has a normal mood and affect. Her behavior is normal. Judgment and thought content normal.  Nursing note and vitals reviewed.         Assessment & Plan:  Healthy female  Hypertension at goal.....Marland Kitchen continue current medications  Hyperlipidemia goal......... continue current medication  History of melanoma......... followed carefully by dermatology

## 2014-06-18 ENCOUNTER — Other Ambulatory Visit: Payer: Self-pay | Admitting: Family Medicine

## 2014-08-03 ENCOUNTER — Other Ambulatory Visit: Payer: Self-pay

## 2014-08-03 DIAGNOSIS — Z1231 Encounter for screening mammogram for malignant neoplasm of breast: Secondary | ICD-10-CM

## 2014-09-01 ENCOUNTER — Encounter: Payer: Self-pay | Admitting: Internal Medicine

## 2014-09-02 ENCOUNTER — Ambulatory Visit
Admission: RE | Admit: 2014-09-02 | Discharge: 2014-09-02 | Disposition: A | Discharge status: Home / Self Care | Payer: Medicare Other | Source: Ambulatory Visit

## 2014-09-02 DIAGNOSIS — Z1231 Encounter for screening mammogram for malignant neoplasm of breast: Secondary | ICD-10-CM

## 2014-12-13 ENCOUNTER — Other Ambulatory Visit: Payer: Self-pay | Admitting: Family Medicine

## 2015-03-23 ENCOUNTER — Other Ambulatory Visit: Payer: Self-pay

## 2015-03-23 DIAGNOSIS — Z1231 Encounter for screening mammogram for malignant neoplasm of breast: Secondary | ICD-10-CM

## 2015-06-06 ENCOUNTER — Other Ambulatory Visit: Payer: Self-pay | Admitting: Family Medicine

## 2015-06-15 ENCOUNTER — Other Ambulatory Visit: Payer: Self-pay | Admitting: Family Medicine

## 2015-07-04 ENCOUNTER — Other Ambulatory Visit: Payer: Medicare Other

## 2015-07-11 ENCOUNTER — Encounter: Payer: Medicare Other | Admitting: Family Medicine

## 2015-09-05 ENCOUNTER — Ambulatory Visit
Admission: RE | Admit: 2015-09-05 | Discharge: 2015-09-05 | Disposition: A | Discharge status: Home / Self Care | Payer: Medicare Other | Source: Ambulatory Visit

## 2015-09-05 ENCOUNTER — Other Ambulatory Visit: Payer: Self-pay | Admitting: Obstetrics & Gynecology

## 2015-09-05 DIAGNOSIS — Z1231 Encounter for screening mammogram for malignant neoplasm of breast: Secondary | ICD-10-CM

## 2015-09-15 ENCOUNTER — Other Ambulatory Visit (INDEPENDENT_AMBULATORY_CARE_PROVIDER_SITE_OTHER): Payer: Medicare Other

## 2015-09-15 DIAGNOSIS — Z0000 Encounter for general adult medical examination without abnormal findings: Secondary | ICD-10-CM

## 2015-09-15 LAB — CBC WITH DIFFERENTIAL/PLATELET
BASOS PCT: 0.9 % (ref 0.0–3.0)
Basophils Absolute: 0.1 10*3/uL (ref 0.0–0.1)
Eosinophils Absolute: 0.2 10*3/uL (ref 0.0–0.7)
Eosinophils Relative: 2.9 % (ref 0.0–5.0)
HCT: 37.9 % (ref 36.0–46.0)
Hemoglobin: 12.9 g/dL (ref 12.0–15.0)
LYMPHS ABS: 2.2 10*3/uL (ref 0.7–4.0)
Lymphocytes Relative: 31.5 % (ref 12.0–46.0)
MCHC: 34 g/dL (ref 30.0–36.0)
MCV: 89.1 fl (ref 78.0–100.0)
MONO ABS: 0.6 10*3/uL (ref 0.1–1.0)
MONOS PCT: 8.8 % (ref 3.0–12.0)
NEUTROS ABS: 3.9 10*3/uL (ref 1.4–7.7)
NEUTROS PCT: 55.9 % (ref 43.0–77.0)
PLATELETS: 296 10*3/uL (ref 150.0–400.0)
RBC: 4.26 Mil/uL (ref 3.87–5.11)
RDW: 13.6 % (ref 11.5–15.5)
WBC: 7 10*3/uL (ref 4.0–10.5)

## 2015-09-15 LAB — TSH: TSH: 1.07 u[IU]/mL (ref 0.35–4.50)

## 2015-09-15 LAB — HEPATIC FUNCTION PANEL
ALBUMIN: 4.5 g/dL (ref 3.5–5.2)
ALT: 14 U/L (ref 0–35)
AST: 16 U/L (ref 0–37)
Alkaline Phosphatase: 53 U/L (ref 39–117)
Bilirubin, Direct: 0.1 mg/dL (ref 0.0–0.3)
TOTAL PROTEIN: 7.3 g/dL (ref 6.0–8.3)
Total Bilirubin: 0.5 mg/dL (ref 0.2–1.2)

## 2015-09-15 LAB — LIPID PANEL
CHOLESTEROL: 185 mg/dL (ref 0–200)
HDL: 65.1 mg/dL (ref >39.00)
LDL Cholesterol: 97 mg/dL (ref 0–99)
NonHDL: 119.53
TRIGLYCERIDES: 114 mg/dL (ref 0.0–149.0)
Total CHOL/HDL Ratio: 3
VLDL: 22.8 mg/dL (ref 0.0–40.0)

## 2015-09-15 LAB — BASIC METABOLIC PANEL
BUN: 14 mg/dL (ref 6–23)
CO2: 27 meq/L (ref 19–32)
Calcium: 9.2 mg/dL (ref 8.4–10.5)
Chloride: 97 mEq/L (ref 96–112)
Creatinine, Ser: 0.89 mg/dL (ref 0.40–1.20)
GFR: 66.59 mL/min (ref >60.00)
GLUCOSE: 102 mg/dL — AB (ref 70–99)
POTASSIUM: 3.9 meq/L (ref 3.5–5.1)
SODIUM: 132 meq/L — AB (ref 135–145)

## 2015-09-22 ENCOUNTER — Ambulatory Visit (INDEPENDENT_AMBULATORY_CARE_PROVIDER_SITE_OTHER): Payer: Medicare Other | Admitting: Family Medicine

## 2015-09-22 ENCOUNTER — Encounter: Payer: Self-pay | Admitting: Family Medicine

## 2015-09-22 VITALS — BP 114/72 | HR 92 | Temp 98.3°F | Resp 12 | Ht 64.0 in | Wt 149.2 lb

## 2015-09-22 DIAGNOSIS — E785 Hyperlipidemia, unspecified: Secondary | ICD-10-CM

## 2015-09-22 DIAGNOSIS — F419 Anxiety disorder, unspecified: Secondary | ICD-10-CM | POA: Insufficient documentation

## 2015-09-22 DIAGNOSIS — Z0000 Encounter for general adult medical examination without abnormal findings: Secondary | ICD-10-CM

## 2015-09-22 DIAGNOSIS — I10 Essential (primary) hypertension: Secondary | ICD-10-CM

## 2015-09-22 DIAGNOSIS — G4700 Insomnia, unspecified: Secondary | ICD-10-CM

## 2015-09-22 MED ORDER — SIMVASTATIN 20 MG PO TABS: 10.0000 mg | ORAL_TABLET | ORAL | 3 refills | Status: DC

## 2015-09-22 MED ORDER — LISINOPRIL-HYDROCHLOROTHIAZIDE 20-25 MG PO TABS: 1.0000 | ORAL_TABLET | Freq: Every day | ORAL | 2 refills | Status: DC

## 2015-09-22 MED ORDER — LORAZEPAM 0.5 MG PO TABS: 0.2500 mg | ORAL_TABLET | Freq: Every day | ORAL | 2 refills | Status: DC | PRN

## 2015-09-22 NOTE — Progress Notes (Signed)
HPI:   Nicole [name] is a 70 y.o. female, who is here today to establish care with me , for her routine physical, and to follow on some of her chronic medical problems. Former Dr Honor Junes pt.  She exercises regularly and does follow a healthy diet.  She lives with husband. Independent ADL's and IADL's.  Chronic medical problems: HTN,HLD,anxiety  No hearing loss. Wears eye glasses for reading.  Pap smear : follows with gyn, has pap smear every 2 years Hx of abnormal pap smears: Denies Hx of STD's: Denies.  Mammogram 08/2015,Birads 1 Colonoscopy 7 years ago, planning on scheduling it. DEXA: a few years ago, reported as normal.  FHx for gynecologic or colon cancer negative.  She has no concerns today, needs medications refill.   Hep C screening: denies risk factors, she is not interested in having it.   Hyperlipidemia:  Currently on Zocor 20 mg 1/2 tab 3 times per week. Following a low fat diet: Yes.  She has not noted side effects with medication as she takes it now, daily med causes aching.  Lab Results  Component Value Date   CHOL 185 09/15/2015   HDL 65.10 09/15/2015   LDLCALC 97 09/15/2015   LDLDIRECT 172.5 01/01/2012   TRIG 114.0 09/15/2015   CHOLHDL 3 09/15/2015   Lab Results  Component Value Date   ALT 14 09/15/2015   AST 16 09/15/2015   ALKPHOS 53 09/15/2015   BILITOT 0.5 09/15/2015    Hypertension:    Currently on Lisinopril-HCTZ 20-25 mg daily    She is taking medications as instructed, no side effects reported.  She has not noted unusual headache, visual changes, exertional chest pain, dyspnea,  focal weakness, or edema.   Lab Results  Component Value Date   CREATININE 0.89 09/15/2015   BUN 14 09/15/2015   NA 132 (L) 09/15/2015   K 3.9 09/15/2015   CL 97 09/15/2015   CO2 27 09/15/2015   She takes Aspirin 81 mg daily. Denies any prior Hx of CVD.  Anxiety: She is also requesting a refill on Lorazepam 0.5 mg, which she  usually takes as needed, when flying and sometimes for sleep. She denies any side effect, usually a Rx last months and she still has refills from prior Rx but expired.  She denies depression or suicidal thoughts.      Review of Systems  Constitutional: Negative for appetite change, fatigue, fever and unexpected weight change.  HENT: Negative for dental problem, hearing loss, mouth sores, trouble swallowing and voice change.   Eyes: Negative for photophobia and visual disturbance.  Respiratory: Negative for cough, shortness of breath and wheezing.   Cardiovascular: Negative for chest pain and leg swelling.  Gastrointestinal: Negative for abdominal pain, nausea and vomiting.       No changes in bowel habits.  Endocrine: Negative for cold intolerance, heat intolerance, polydipsia, polyphagia and polyuria.  Genitourinary: Negative for decreased urine volume, difficulty urinating, dysuria, hematuria, vaginal bleeding and vaginal discharge.  Musculoskeletal: Negative for arthralgias, back pain and neck pain.  Skin: Negative for color change and rash.  Neurological: Negative for seizures, syncope, weakness, numbness and headaches.  Hematological: Negative for adenopathy. Does not bruise/bleed easily.  Psychiatric/Behavioral: Positive for sleep disturbance. Negative for confusion. The patient is nervous/anxious.   All other systems reviewed and are negative.     Current Outpatient Prescriptions on File Prior to Visit  Medication Sig Dispense Refill  . aspirin 81 MG tablet Take 81  mg by mouth daily.     . Multiple Vitamin (MULTIVITAMIN WITH MINERALS) TABS Take 1 tablet by mouth daily.     No current facility-administered medications on file prior to visit.      Past Medical History:  Diagnosis Date  . Allergic rhinitis   . Dislocated shoulder    right   . Flying phobia    General anxieety also-xanax prn  . Hematuria    asymptomatic  . Hx of colonic polyps   . Hypertension      Allergies  Allergen Reactions  . Oysters [Shellfish Allergy] Swelling    Swelling of lips next day after eating oysters  . Penicillins Hives  . Sulfamethoxazole-Trimethoprim Other (See Comments)    REACTION: turned firey red    Family History  Problem Relation Age of Onset  . Hypertension Mother   . Osteoporosis Mother   . Lymphoma Father     Social History   Social History  . Marital status: Married    Spouse name: N/A  . Number of children: N/A  . Years of education: N/A   Social History Main Topics  . Smoking status: Former Research scientist (life sciences)  . Smokeless tobacco: None  . Alcohol use Yes     Comment: Occasionnally-social  . Drug use: No  . Sexual activity: Not Asked   Other Topics Concern  . None   Social History Narrative  . None     Vitals:   09/22/15 1348  BP: 114/72  Pulse: 92  Resp: 12  Temp: 98.3 F (36.8 C)   Body mass index is 25.61 kg/m.  O2 sat at RA 97%.  Wt Readings from Last 3 Encounters:  09/22/15 149 lb 3.2 oz (67.7 kg)  05/11/14 151 lb (68.5 kg)  04/28/13 154 lb 3.2 oz (69.9 kg)      Physical Exam  Nursing note and vitals reviewed. Constitutional: She is oriented to person, place, and time. She appears well-developed and well-nourished. No distress.  HENT:  Head: Atraumatic.  Right Ear: External ear normal.  Left Ear: External ear normal.  Mouth/Throat: Uvula is midline, oropharynx is clear and moist and mucous membranes are normal.  Eyes: Conjunctivae and EOM are normal. Pupils are equal, round, and reactive to light.  Neck: No thyromegaly present.  Cardiovascular: Normal rate and regular rhythm.   No murmur heard. Pulses:      Dorsalis pedis pulses are 2+ on the right side, and 2+ on the left side.       Posterior tibial pulses are 2+ on the right side, and 2+ on the left side.  Respiratory: Effort normal and breath sounds normal. No respiratory distress.  GI: Soft. She exhibits no mass. There is no hepatomegaly. There is no  tenderness.  Musculoskeletal: She exhibits no edema.  No major deformity or sing of synovitis appreciated.  Lymphadenopathy:    She has no cervical adenopathy.       Right: No supraclavicular adenopathy present.       Left: No supraclavicular adenopathy present.  Neurological: She is alert and oriented to person, place, and time. She has normal strength. No cranial nerve deficit. Coordination and gait normal.  Reflex Scores:      Bicep reflexes are 2+ on the right side and 2+ on the left side.      Patellar reflexes are 2+ on the right side and 2+ on the left side. Skin: Skin is warm. No rash noted. No erythema.  Psychiatric: She has a normal mood  and affect. Her speech is normal.  Well groomed, good eye contact.      ASSESSMENT AND PLAN:      Omara was seen today for annual exam.  Diagnoses and all orders for this visit:  Routine general medical examination at a health care facility   We discussed the importance of regular physical activity and healthy diet for prevention of chronic illness and/or complications. Preventive guidelines reviewed. Vaccination is not up to date, she prefers to hold on Prevnar 13 until she comes back from visiting son and his family. Aspirin for primary prevention ,small benefits, some side effects discussed. Ca++ and vit D supplementation recommended. Fall precautions. Continue following with gyn. Next CPE in 1 year.    Hyperlipidemia  Well controlled. No changes in current management. F/U in 12 months.  -     simvastatin (ZOCOR) 20 MG tablet; Take 0.5 tablets (10 mg total) by mouth 3 (three) times a week. Mondays, wednesdays, & saturdays  Insomnia, unspecified  Good sleep hygiene. OTC Melatonin ER 5 mg may also help. No changes in Lorazepam, some side effects discussed. F/U in 6 months.  -     LORazepam (ATIVAN) 0.5 MG tablet; Take 0.5-1 tablets (0.25-0.5 mg total) by mouth daily as needed for anxiety or sleep.  Essential  hypertension, benign  Adequately controlled. No changes in current management. DASH/low salt diet recommended. Eye exam recommended annually. F/U in 6 months, before if needed.  -     lisinopril-hydrochlorothiazide (PRINZIDE,ZESTORETIC) 20-25 MG tablet; Take 1 tablet by mouth daily.  Anxiety disorder, unspecified  Stable. No changes in Lorazepam dose.      Return in about 6 months (around 03/21/2016) for HTN, anxiety/insomnia.          Shahmeer Bunn G. Martinique, MD  [facility]. [place] office.

## 2015-09-22 NOTE — Patient Instructions (Addendum)
A few things to remember from today's visit:   Routine general medical examination at a health care facility  Hyperlipidemia - Plan: simvastatin (ZOCOR) 20 MG tablet  Insomnia, unspecified - Plan: LORazepam (ATIVAN) 0.5 MG tablet  Essential hypertension, benign - Plan: lisinopril-hydrochlorothiazide (PRINZIDE,ZESTORETIC) 20-25 MG tablet  Anxiety disorder, unspecified  A few tips:  -As we age balance is not as good as it was, so there is a higher risks for falls. Please remove small rugs and furniture that is "in your way" and could increase the risk of falls. Stretching exercises may help with fall prevention: Yoga and Tai Chi are some examples. Low impact exercise is better, so you are not very achy the next day.  -Sun screen and avoidance of direct sun light recommended. Caution with dehydration, if working outdoors be sure to drink enough fluids.  - Some medications are not safe as we age, increases the risk of side effects and can potentially interact with other medication you are also taken;  including some of over the counter medications. Be sure to let me know when you start a new medication even if it is a dietary/vitamin supplement.   -Healthy diet low in red meet/animal fat and sugar + regular physical activity is recommended.    Please schedule colonoscopy. Calcium 1200 mg daily, ideally through diet. Vitamin D [phone] units daily. Pneumonia vaccine is needed.  Decrease intake some help with low sodium.   Please be sure medication list is accurate. If a new problem present, please set up appointment sooner than planned today.

## 2015-09-22 NOTE — Progress Notes (Signed)
Pre visit review using our clinic review tool, if applicable. No additional management support is needed unless otherwise documented below in the visit note. 

## 2015-12-29 ENCOUNTER — Other Ambulatory Visit: Payer: Medicare Other

## 2016-01-02 ENCOUNTER — Encounter: Payer: Medicare Other | Admitting: Family Medicine

## 2016-03-21 ENCOUNTER — Ambulatory Visit: Payer: Medicare Other | Admitting: Family Medicine

## 2016-05-09 ENCOUNTER — Other Ambulatory Visit: Payer: Self-pay | Admitting: Obstetrics & Gynecology

## 2016-05-09 DIAGNOSIS — Z1231 Encounter for screening mammogram for malignant neoplasm of breast: Secondary | ICD-10-CM

## 2016-05-22 NOTE — Progress Notes (Signed)
HPI:   Nicole [name] is a 71 y.o. female, who is here today to follow on some chronic medical problems.  She was last seen on 09/22/15 for her CPE.  HTN: Currently she is on Lisinopril-HCTZ 20-25 mg daily. Denies severe/frequent headache, visual changes, chest pain, dyspnea, palpitation, claudication, focal weakness, or edema. HypoNa++ Denies alcohol abuse,drinks 1/2 beer occasionally.  Lab Results  Component Value Date   CREATININE 0.89 09/15/2015   BUN 14 09/15/2015   NA 132 (L) 09/15/2015   K 3.9 09/15/2015   CL 97 09/15/2015   CO2 27 09/15/2015    Anxiety: She is on Lorazepam 0.5 mg daily as needed,usually for insomnia or with acute anxiety, when flying.Takes Lorazepam for insomnia about q 2-3 days. Tolerating medication well ,no side effects reported. She denies depressed mood or suicidal thoughts.   Review of Systems  Constitutional: Positive for fatigue. Negative for activity change, appetite change, fever and unexpected weight change.  HENT: Negative for mouth sores, nosebleeds, tinnitus and trouble swallowing.   Eyes: Negative for redness and visual disturbance.  Respiratory: Negative for cough, shortness of breath and wheezing.   Cardiovascular: Negative for chest pain, palpitations and leg swelling.  Gastrointestinal: Negative for abdominal pain, nausea and vomiting.       Negative for changes in bowel habits.  Genitourinary: Negative for decreased urine volume and hematuria.  Neurological: Negative for syncope, weakness and headaches.  Psychiatric/Behavioral: Positive for sleep disturbance. Negative for confusion, hallucinations and suicidal ideas. The patient is nervous/anxious.       Current Outpatient Prescriptions on File Prior to Visit  Medication Sig Dispense Refill  . aspirin 81 MG tablet Take 81 mg by mouth daily.     Marland Kitchen lisinopril-hydrochlorothiazide (PRINZIDE,ZESTORETIC) 20-25 MG tablet Take 1 tablet by mouth daily. 90 tablet 2  .  LORazepam (ATIVAN) 0.5 MG tablet Take 0.5-1 tablets (0.25-0.5 mg total) by mouth daily as needed for anxiety or sleep. 30 tablet 2  . Multiple Vitamin (MULTIVITAMIN WITH MINERALS) TABS Take 1 tablet by mouth daily.    . simvastatin (ZOCOR) 20 MG tablet Take 0.5 tablets (10 mg total) by mouth 3 (three) times a week. Mondays, wednesdays, & saturdays 50 tablet 3   No current facility-administered medications on file prior to visit.      Past Medical History:  Diagnosis Date  . Allergic rhinitis   . Dislocated shoulder    right   . Flying phobia    General anxieety also-xanax prn  . Hematuria    asymptomatic  . Hx of colonic polyps   . Hypertension    Allergies  Allergen Reactions  . Oysters [Shellfish Allergy] Swelling    Swelling of lips next day after eating oysters  . Penicillins Hives  . Sulfamethoxazole-Trimethoprim Other (See Comments)    REACTION: turned firey red    Social History   Social History  . Marital status: Married    Spouse name: N/A  . Number of children: N/A  . Years of education: N/A   Social History Main Topics  . Smoking status: Former Research scientist (life sciences)  . Smokeless tobacco: Never Used  . Alcohol use Yes     Comment: Occasionnally-social  . Drug use: No  . Sexual activity: Not Asked   Other Topics Concern  . None   Social History Narrative  . None    Vitals:   05/23/16 0951  BP: 110/70  Pulse: 99  Resp: 12  O2 sat at RA 98%. Body  mass index is 25.58 kg/m.   Physical Exam  Nursing note and vitals reviewed. Constitutional: She is oriented to person, place, and time. She appears well-developed and well-nourished. No distress.  HENT:  Head: Atraumatic.  Mouth/Throat: Oropharynx is clear and moist and mucous membranes are normal.  Eyes: Conjunctivae and EOM are normal. Pupils are equal, round, and reactive to light.  Cardiovascular: Normal rate and regular rhythm.   No murmur heard. Varicose veins bilateral,LE. DP present bilateral.    Respiratory: Effort normal and breath sounds normal. No respiratory distress.  GI: Soft. She exhibits no mass. There is no hepatomegaly. There is no tenderness.  Musculoskeletal: She exhibits no edema.  Lymphadenopathy:    She has no cervical adenopathy.  Neurological: She is alert and oriented to person, place, and time. She has normal strength. Gait normal.  Skin: Skin is warm. No erythema.  Psychiatric: Her mood appears anxious.  Fairly groomed, good eye contact.     ASSESSMENT AND PLAN:    Jana was seen today for follow-up.  Diagnoses and all orders for this visit:  Lab Results  Component Value Date   CREATININE 0.90 05/23/2016   BUN 16 05/23/2016   NA 132 (L) 05/23/2016   K 4.2 05/23/2016   CL 99 05/23/2016   CO2 26 05/23/2016    Essential hypertension, benign  Adequately controlled. No changes in current management for now. DASH-low salt diet recommended. Eye exam annually, last one within a year. F/U in 6 months, before if needed.   -     Basic metabolic panel  Hyponatremia  We discussed possible causes,including medications. Recommend limiting fluid intake. HCTZ dose decreased from 25 mg to 12.5 mg. Instructed about warning signs.  -     Basic metabolic panel  Anxiety disorder, unspecified type  Stable. No changes in current management.Some side effects of Lorazepam discussed. Fall prevention. F/U in 6 months.  Insomnia, unspecified type  Good sleep hygiene. No changes in Lorazepam dose.      -[name] was advised to return sooner than planned today if new concerns arise.       Zephaniah Lubrano G. Martinique, MD  [facility]. [place] office.

## 2016-05-23 ENCOUNTER — Ambulatory Visit (INDEPENDENT_AMBULATORY_CARE_PROVIDER_SITE_OTHER): Payer: Medicare Other | Admitting: Family Medicine

## 2016-05-23 ENCOUNTER — Encounter: Payer: Self-pay | Admitting: Family Medicine

## 2016-05-23 VITALS — BP 110/70 | HR 99 | Resp 12 | Ht 64.0 in | Wt 149.0 lb

## 2016-05-23 DIAGNOSIS — E871 Hypo-osmolality and hyponatremia: Secondary | ICD-10-CM | POA: Diagnosis not present

## 2016-05-23 DIAGNOSIS — G4700 Insomnia, unspecified: Secondary | ICD-10-CM | POA: Diagnosis not present

## 2016-05-23 DIAGNOSIS — F419 Anxiety disorder, unspecified: Secondary | ICD-10-CM

## 2016-05-23 DIAGNOSIS — I10 Essential (primary) hypertension: Secondary | ICD-10-CM | POA: Diagnosis not present

## 2016-05-23 LAB — BASIC METABOLIC PANEL
BUN: 16 mg/dL (ref 6–23)
CO2: 26 meq/L (ref 19–32)
Calcium: 9.7 mg/dL (ref 8.4–10.5)
Chloride: 99 mEq/L (ref 96–112)
Creatinine, Ser: 0.9 mg/dL (ref 0.40–1.20)
GFR: 65.61 mL/min (ref >60.00)
GLUCOSE: 99 mg/dL (ref 70–99)
POTASSIUM: 4.2 meq/L (ref 3.5–5.1)
SODIUM: 132 meq/L — AB (ref 135–145)

## 2016-05-23 MED ORDER — LISINOPRIL-HYDROCHLOROTHIAZIDE 20-12.5 MG PO TABS: 1.0000 | ORAL_TABLET | Freq: Every day | ORAL | 3 refills | Status: DC

## 2016-05-23 NOTE — Patient Instructions (Signed)
A few things to remember from today's visit:   Essential hypertension, benign - Plan: Basic metabolic panel  Hyponatremia - Plan: Basic metabolic panel  Blood pressure goal for most people is less than 140/90. Some populations (older than 60) the goal is less than 150/90.  Most recent cardiologists' recommendations recommend blood pressure at or less than 130/80.   Elevated blood pressure increases the risk of strokes, heart and kidney disease, and eye problems. Regular physical activity and a healthy diet (DASH diet) usually help. Low salt diet. Take medications as instructed.  Caution with some over the counter medications as cold medications, dietary products (for weight loss), and Ibuprofen or Aleve (frequent use);all these medications could cause elevation of blood pressure.   Please be sure medication list is accurate. If a new problem present, please set up appointment sooner than planned today.

## 2016-05-23 NOTE — Progress Notes (Signed)
Pre visit review using our clinic review tool, if applicable. No additional management support is needed unless otherwise documented below in the visit note. 

## 2016-06-21 ENCOUNTER — Other Ambulatory Visit: Payer: Self-pay | Admitting: Family Medicine

## 2016-06-21 DIAGNOSIS — G4700 Insomnia, unspecified: Secondary | ICD-10-CM

## 2016-06-22 NOTE — Telephone Encounter (Signed)
Rx last filled 03/18/2016. Last office visit 05/23/2016.  Okay to refill?

## 2016-06-26 NOTE — Telephone Encounter (Signed)
Please call pham

## 2016-06-26 NOTE — Telephone Encounter (Signed)
Rx phoned in.   

## 2016-06-26 NOTE — Telephone Encounter (Signed)
Prescription for Lorazepam 0.5 mg can be called in to continue 1/2-1 tab daily as needed. #30/1. [[place] controlled subs web site reviewed,last refill 03/18/16 # 30].  Thanks, BJ

## 2016-08-17 ENCOUNTER — Telehealth: Payer: Self-pay | Admitting: Family Medicine

## 2016-08-17 NOTE — Telephone Encounter (Signed)
Pt is coming in aug for cpx. Pt is concerning about not having blood work in advance due to last time her sodium was low and md changed her bp med. Please advise

## 2016-08-20 NOTE — Telephone Encounter (Signed)
Now as a protocol labs are not done prior to visit. Appropriate labs can be done right after visit.  Thanks, BJ

## 2016-08-21 NOTE — Telephone Encounter (Signed)
Called and spoke with patient. She is aware that labs will done at her appointment.

## 2016-08-27 NOTE — Progress Notes (Signed)
HPI:   Nicole [name] is a 71 y.o. female, who is here today for her routine physical.  Last CPE 09/22/15.  She still follows with gyn regularly, planning on arranging appt. Last week mole removed from left LE by dermatologists, pathology is pending.Next appt 02/2017.  Last eye exam 02/2016.  Regular exercise 3 or more time per week: Yes, walking and swimming. Following a healthy diet: yes. She lives with her husband. Independent ADL's and IADL's.  Functional Status Survey: Is the patient deaf or have difficulty hearing?: No Does the patient have difficulty seeing, even when wearing glasses/contacts?: No Does the patient have difficulty concentrating, remembering, or making decisions?: No Does the patient have difficulty walking or climbing stairs?: No Does the patient have difficulty dressing or bathing?: No Does the patient have difficulty doing errands alone such as visiting a doctor's office or shopping?: No  Chronic medical problems: HTN,HLD,insomnia, hyponatremia,and anxiety among some. She is on Lisinopril-HCTZ 20-12.5 mg for HTN treatment.   Immunization History  Administered Date(s) Administered  . Pneumococcal Conjugate-13 08/28/2016  . Pneumococcal Polysaccharide-23 05/21/2008  . Td 10/23/2005, 09/07/2009    Mammogram: 08/2015 [facility] 1. She has one scheduled for this months. Colonoscopy: 04/2008. She is not interested in fecal test done. She has occasional blood on tissue, attributed to hemorrhoids and for years, even before last colonoscopy. DEXA: "very long time" ago.  Hep C screening: Denies risk factors and she is not interested in screening.   Hyperlipidemia:  Currently on Zocor 10 mg 3 times per week.  Following a low fat diet: Yes..  She has not noted side effects with medication.  Lab Results  Component Value Date   CHOL 185 09/15/2015   HDL 65.10 09/15/2015   LDLCALC 97 09/15/2015   LDLDIRECT 172.5 01/01/2012   TRIG 114.0  09/15/2015   CHOLHDL 3 09/15/2015    Anxiety on Lorazepam 0.5 mg daily as needed, mainly when flying. She just filled last refill, planning on 2 flights this year.    Review of Systems  Constitutional: Negative for appetite change, fatigue, fever and unexpected weight change.  HENT: Negative for hearing loss, mouth sores, trouble swallowing and voice change.   Eyes: Negative for redness and visual disturbance.  Respiratory: Negative for cough, shortness of breath and wheezing.   Cardiovascular: Negative for chest pain and leg swelling.  Gastrointestinal: Negative for abdominal pain, blood in stool, nausea and vomiting.       No changes in bowel habits.  Endocrine: Negative for cold intolerance, heat intolerance, polydipsia, polyphagia and polyuria.  Genitourinary: Negative for decreased urine volume, dysuria, hematuria, vaginal bleeding and vaginal discharge.  Musculoskeletal: Negative for gait problem and neck pain.  Skin: Negative for color change and rash.  Allergic/Immunologic: Negative for environmental allergies.  Neurological: Negative for syncope, weakness and headaches.  Hematological: Negative for adenopathy. Does not bruise/bleed easily.  Psychiatric/Behavioral: Positive for sleep disturbance. Negative for confusion. The patient is nervous/anxious.   All other systems reviewed and are negative.     Current Outpatient Prescriptions on File Prior to Visit  Medication Sig Dispense Refill  . aspirin 81 MG tablet Take 81 mg by mouth daily.     Marland Kitchen lisinopril-hydrochlorothiazide (ZESTORETIC) 20-12.5 MG tablet Take 1 tablet by mouth daily. 90 tablet 3  . LORazepam (ATIVAN) 0.5 MG tablet TAKE 1/2 TO 1 TABLET BY MOUTH EVERY DAY AS NEEDED FOR ANXIETY OR SLEEP 30 tablet 1  . Multiple Vitamin (MULTIVITAMIN WITH MINERALS) TABS Take  1 tablet by mouth daily.    . simvastatin (ZOCOR) 20 MG tablet Take 0.5 tablets (10 mg total) by mouth 3 (three) times a week. Mondays, wednesdays, &  saturdays 50 tablet 3   No current facility-administered medications on file prior to visit.      Past Medical History:  Diagnosis Date  . Allergic rhinitis   . Dislocated shoulder    right   . Flying phobia    General anxieety also-xanax prn  . Hematuria    asymptomatic  . Hx of colonic polyps   . Hypertension    Past Surgical History:  Procedure Laterality Date  . HERNIA REPAIR  1999   Dr. Lara Mulch  . LAPAROSCOPY Right 08/07/2012   Procedure: LAPAROSCOPY OPERATIVE;  Surgeon: Elveria Royals, MD;  Location: [facility];  Service: Gynecology;  Laterality: Right;  Removal Right Ovarian Cyst Wall, Pelvic Washings     Allergies  Allergen Reactions  . Oysters [Shellfish Allergy] Swelling    Swelling of lips next day after eating oysters  . Penicillins Hives  . Sulfamethoxazole-Trimethoprim Other (See Comments)    REACTION: turned firey red    Family History  Problem Relation Age of Onset  . Hypertension Mother   . Osteoporosis Mother   . Lymphoma Father     Social History   Social History  . Marital status: Married    Spouse name: N/A  . Number of children: N/A  . Years of education: N/A   Social History Main Topics  . Smoking status: Former Research scientist (life sciences)  . Smokeless tobacco: Never Used  . Alcohol use Yes     Comment: Occasionnally-social  . Drug use: No  . Sexual activity: Not Asked   Other Topics Concern  . None   Social History Narrative  . None     Vitals:   08/28/16 0903  BP: 118/80  Pulse: 87  Resp: 12  SpO2: 98%   Body mass index is 25.77 kg/m.  O2 sat at RA 98%  Wt Readings from Last 3 Encounters:  08/28/16 150 lb 2 oz (68.1 kg)  05/23/16 149 lb (67.6 kg)  09/22/15 149 lb 3.2 oz (67.7 kg)     Physical Exam  Nursing note and vitals reviewed. Constitutional: She is oriented to person, place, and time. She appears well-developed and well-nourished. No distress.  HENT:  Head: Atraumatic.  Right Ear: Hearing, tympanic  membrane, external ear and ear canal normal.  Left Ear: Hearing, tympanic membrane, external ear and ear canal normal.  Mouth/Throat: Uvula is midline, oropharynx is clear and moist and mucous membranes are normal.  Eyes: Pupils are equal, round, and reactive to light. Conjunctivae and EOM are normal.  Neck: No tracheal deviation present. No thyromegaly present.  Cardiovascular: Normal rate and regular rhythm.   No murmur heard. Pulses:      Dorsalis pedis pulses are 2+ on the right side, and 2+ on the left side.  Respiratory: Effort normal and breath sounds normal. No respiratory distress.  GI: Soft. She exhibits no mass. There is no hepatomegaly. There is no tenderness.  Musculoskeletal: She exhibits no edema.  No significant deformity or sign of synovitis appreciated.  Lymphadenopathy:    She has no cervical adenopathy.       Right: No supraclavicular adenopathy present.       Left: No supraclavicular adenopathy present.  Neurological: She is alert and oriented to person, place, and time. She has normal strength. No cranial nerve deficit. Coordination and gait  normal.  Reflex Scores:      Bicep reflexes are 2+ on the right side and 2+ on the left side.      Patellar reflexes are 2+ on the right side and 2+ on the left side. Skin: Skin is warm. No rash noted. No erythema.  Psychiatric: She has a normal mood and affect. Her speech is normal.  Well groomed, good eye contact.    ASSESSMENT AND PLAN:   Nicole [name] was seen today for annual exam.  Diagnoses and all orders for this visit:  Lab Results  Component Value Date   CHOL 183 08/28/2016   HDL 63.60 08/28/2016   LDLCALC 91 08/28/2016   LDLDIRECT 172.5 01/01/2012   TRIG 140.0 08/28/2016   CHOLHDL 3 08/28/2016   Lab Results  Component Value Date   CREATININE 0.86 08/28/2016   BUN 12 08/28/2016   NA 135 08/28/2016   K 4.5 08/28/2016   CL 99 08/28/2016   CO2 29 08/28/2016    Routine general medical examination at a  health care facility  We discussed the importance of regular physical activity and healthy diet for prevention of chronic illness and/or complications. Preventive guidelines reviewed. Refused HCV screening. Vaccination updated. Aspirin for primary prevention discussed, tolerating well,so continue. Ca++ and vit D supplementation recommended. Next CPE in 1 year.  The 10-year ASCVD risk score Mikey Bussing DC Brooke Bonito., et al., 2013) is: 11.5%   Values used to calculate the score:     Age: 76 years     Sex: Female     Is Non-Hispanic African American: No     Diabetic: No     Tobacco smoker: No     Systolic Blood Pressure: 009 mmHg     Is BP treated: Yes     HDL Cholesterol: 63.6 mg/dL     Total Cholesterol: 183 mg/dL   Hyperlipidemia, unspecified hyperlipidemia type  No changes in current management, will follow labs done today and will give further recommendations accordingly. F/U in 6-12 months.  -     Lipid panel  Hyponatremia  Further recommendations will be given according to lab results.  -     Basic metabolic panel  Asymptomatic postmenopausal estrogen deficiency -     DG Bone Density; Future  Need for vaccination with 13-polyvalent pneumococcal conjugate vaccine -     Pneumococcal conjugate vaccine 13-valent  Anxiety disorder, unspecified type  Stable. She will call for refills when close to running out. Some side effects discussed.    Return in 6 months (on 02/28/2017) for HTN.    Betty G. Martinique, MD  [facility]. [place] office.

## 2016-08-28 ENCOUNTER — Ambulatory Visit (INDEPENDENT_AMBULATORY_CARE_PROVIDER_SITE_OTHER): Payer: Medicare Other | Admitting: Family Medicine

## 2016-08-28 ENCOUNTER — Encounter: Payer: Self-pay | Admitting: Family Medicine

## 2016-08-28 VITALS — BP 118/80 | HR 87 | Resp 12 | Ht 64.0 in | Wt 150.1 lb

## 2016-08-28 DIAGNOSIS — E785 Hyperlipidemia, unspecified: Secondary | ICD-10-CM | POA: Diagnosis not present

## 2016-08-28 DIAGNOSIS — E871 Hypo-osmolality and hyponatremia: Secondary | ICD-10-CM | POA: Diagnosis not present

## 2016-08-28 DIAGNOSIS — Z23 Encounter for immunization: Secondary | ICD-10-CM | POA: Diagnosis not present

## 2016-08-28 DIAGNOSIS — F419 Anxiety disorder, unspecified: Secondary | ICD-10-CM

## 2016-08-28 DIAGNOSIS — Z780 Asymptomatic menopausal state: Secondary | ICD-10-CM

## 2016-08-28 DIAGNOSIS — Z0000 Encounter for general adult medical examination without abnormal findings: Secondary | ICD-10-CM | POA: Diagnosis not present

## 2016-08-28 LAB — BASIC METABOLIC PANEL
BUN: 12 mg/dL (ref 6–23)
CO2: 29 mEq/L (ref 19–32)
Calcium: 9.8 mg/dL (ref 8.4–10.5)
Chloride: 99 mEq/L (ref 96–112)
Creatinine, Ser: 0.86 mg/dL (ref 0.40–1.20)
GFR: 69.09 mL/min (ref >60.00)
Glucose, Bld: 106 mg/dL — ABNORMAL HIGH (ref 70–99)
POTASSIUM: 4.5 meq/L (ref 3.5–5.1)
Sodium: 135 mEq/L (ref 135–145)

## 2016-08-28 LAB — LIPID PANEL
CHOLESTEROL: 183 mg/dL (ref 0–200)
HDL: 63.6 mg/dL (ref >39.00)
LDL Cholesterol: 91 mg/dL (ref 0–99)
NonHDL: 119.09
TRIGLYCERIDES: 140 mg/dL (ref 0.0–149.0)
Total CHOL/HDL Ratio: 3
VLDL: 28 mg/dL (ref 0.0–40.0)

## 2016-08-28 NOTE — Patient Instructions (Signed)
A few things to remember from today's visit:   Routine general medical examination at a health care facility  Hyperlipidemia, unspecified hyperlipidemia type - Plan: Lipid panel  Hyponatremia - Plan: Basic metabolic panel  Insomnia, unspecified type    At least 150 minutes of moderate exercise per week, daily brisk walking for 15-30 min is a good exercise option. Healthy diet low in saturated (animal) fats and sweets and consisting of fresh fruits and vegetables, lean meats such as fish and white chicken and whole grains.   - Vaccines:   Pneumonia vaccines:  Prevnar 13 given today.  Screening recommendations for low/normal risk women:  Screening for diabetes at age 71-45 and every 3 years.    -Breast cancer: q 1- 2 years. Screening is recommended until 71 years old but some women can continue screening depending of healthy issues.   Colon cancer screening: starts at 71 years old until 71 years old.    Also recommended:  1. Dental visit- Brush and floss your teeth twice daily; visit your dentist twice a year. 2. Eye doctor- Get an eye exam at least every 2 years. 3. Helmet use- Always wear a helmet when riding a bicycle, motorcycle, rollerblading or skateboarding. 4. Safe sex- If you may be exposed to sexually transmitted infections, use a condom. 5. Seat belts- Seat belts can save your live; always wear one. 6. Smoke/Carbon Monoxide detectors- These detectors need to be installed on the appropriate level of your home. Replace batteries at least once a year. 7. Skin cancer- When out in the sun please cover up and use sunscreen 15 SPF or higher. 8. Violence- If anyone is threatening or hurting you, please tell your healthcare provider.  9. Drink alcohol in moderation- Limit alcohol intake to one drink or less per day. Never drink and drive.   Please be sure medication list is accurate. If a new problem present, please set up appointment sooner than planned  today.

## 2016-09-05 ENCOUNTER — Encounter: Payer: Self-pay | Admitting: Family Medicine

## 2016-09-05 ENCOUNTER — Ambulatory Visit
Admission: RE | Admit: 2016-09-05 | Discharge: 2016-09-05 | Disposition: A | Discharge status: Home / Self Care | Payer: Medicare Other | Source: Ambulatory Visit | Attending: Obstetrics & Gynecology | Admitting: Obstetrics & Gynecology

## 2016-09-05 DIAGNOSIS — Z1231 Encounter for screening mammogram for malignant neoplasm of breast: Secondary | ICD-10-CM

## 2016-09-11 ENCOUNTER — Ambulatory Visit: Payer: Medicare Other

## 2016-10-11 ENCOUNTER — Encounter: Payer: Self-pay | Admitting: Family Medicine

## 2016-11-29 ENCOUNTER — Other Ambulatory Visit: Payer: Self-pay | Admitting: Family Medicine

## 2016-11-29 DIAGNOSIS — G4700 Insomnia, unspecified: Secondary | ICD-10-CM

## 2016-12-03 MED ORDER — LORAZEPAM 0.5 MG PO TABS: ORAL_TABLET | ORAL | 2 refills | Status: DC

## 2016-12-03 NOTE — Telephone Encounter (Signed)
Rx ready for pickup. Left message for patient.

## 2016-12-03 NOTE — Telephone Encounter (Signed)
Rx for Lorazepam 0.5 mg can be called in to continue daily as needed for anxiety. #30/2  Thanks, BJ

## 2016-12-03 NOTE — Addendum Note (Signed)
Addended by: Zacarias Pontes on: 12/03/2016 03:45 PM   Modules accepted: Orders

## 2016-12-14 ENCOUNTER — Other Ambulatory Visit: Payer: Self-pay | Admitting: Family Medicine

## 2016-12-14 DIAGNOSIS — E785 Hyperlipidemia, unspecified: Secondary | ICD-10-CM

## 2017-03-11 ENCOUNTER — Ambulatory Visit: Payer: Medicare Other | Admitting: Family Medicine

## 2017-03-11 DIAGNOSIS — Z0289 Encounter for other administrative examinations: Secondary | ICD-10-CM

## 2017-03-19 ENCOUNTER — Other Ambulatory Visit: Payer: Self-pay | Admitting: Obstetrics & Gynecology

## 2017-03-19 DIAGNOSIS — Z1231 Encounter for screening mammogram for malignant neoplasm of breast: Secondary | ICD-10-CM

## 2017-05-12 ENCOUNTER — Other Ambulatory Visit: Payer: Self-pay | Admitting: Family Medicine

## 2017-05-13 NOTE — Telephone Encounter (Signed)
Left message to give clinic a call back to schedule follow-up for medication refill.

## 2017-05-14 NOTE — Progress Notes (Signed)
HPI:   [name] is a 72 y.o. female, who is here today for 6 months follow up.   She was last seen on 09/05/16 for her CPE.  Hypertension:   Currently on Lisinopril-HCTZ 20-12.5 mg daily.   Home BP's: Not checking. Last eye exam: within a year ago,appt next Monday. She is taking medications as instructed, no side effects reported.  She has not noted unusual headache, visual changes, exertional chest pain, dyspnea,  focal weakness, or edema.   Lab Results  Component Value Date   CREATININE 0.86 08/28/2016   BUN 12 08/28/2016   NA 135 08/28/2016   K 4.5 08/28/2016   CL 99 08/28/2016   CO2 29 08/28/2016   Hx of hypoNa++, no MS changes, abdominal pain,nausea,vomiting,or changes in bowel habits.  We have been treating this problem with fluid restriction.    Anxiety: On Lorazepam 0.5 mg daily as needed. Exacerbated by flying. Occasionally she takes medication at night to help her sleep.   Review of Systems  Constitutional: Negative for activity change, appetite change, fatigue and fever.  HENT: Negative for mouth sores, nosebleeds and trouble swallowing.   Eyes: Negative for redness and visual disturbance.  Respiratory: Negative for cough, shortness of breath and wheezing.   Cardiovascular: Negative for chest pain, palpitations and leg swelling.  Gastrointestinal: Negative for abdominal pain, nausea and vomiting.       Negative for changes in bowel habits.  Genitourinary: Negative for decreased urine volume and hematuria.  Neurological: Negative for syncope, weakness and headaches.  Psychiatric/Behavioral: The patient is nervous/anxious.      Current Outpatient Medications on File Prior to Visit  Medication Sig Dispense Refill  . aspirin 81 MG tablet Take 81 mg by mouth daily.     Marland Kitchen LORazepam (ATIVAN) 0.5 MG tablet TAKE 1/2 TO 1 TABLET BY MOUTH DAILY AS NEEDED FOR ANXIETY 30 tablet 2  . Multiple Vitamin (MULTIVITAMIN WITH MINERALS) TABS Take 1 tablet  by mouth daily.    . simvastatin (ZOCOR) 20 MG tablet TAKE 1/2 A TABLET BY MOUTH THREE TIMES A WEEK ON MON, WED, AND SAT 50 tablet 2   No current facility-administered medications on file prior to visit.      Past Medical History:  Diagnosis Date  . Allergic rhinitis   . Dislocated shoulder    right   . Flying phobia    General anxieety also-xanax prn  . Hematuria    asymptomatic  . Hx of colonic polyps   . Hypertension    Allergies  Allergen Reactions  . Oysters [Shellfish Allergy] Swelling    Swelling of lips next day after eating oysters  . Penicillins Hives  . Sulfamethoxazole-Trimethoprim Other (See Comments)    REACTION: turned firey red    Social History   Socioeconomic History  . Marital status: Married    Spouse name: Not on file  . Number of children: Not on file  . Years of education: Not on file  . Highest education level: Not on file  Occupational History  . Not on file  Social Needs  . Financial resource strain: Not on file  . Food insecurity:    Worry: Not on file    Inability: Not on file  . Transportation needs:    Medical: Not on file    Non-medical: Not on file  Tobacco Use  . Smoking status: Former Research scientist (life sciences)  . Smokeless tobacco: Never Used  Substance and Sexual Activity  . Alcohol  use: Yes    Comment: Occasionnally-social  . Drug use: No  . Sexual activity: Not on file  Lifestyle  . Physical activity:    Days per week: Not on file    Minutes per session: Not on file  . Stress: Not on file  Relationships  . Social connections:    Talks on phone: Not on file    Gets together: Not on file    Attends religious service: Not on file    Active member of club or organization: Not on file    Attends meetings of clubs or organizations: Not on file    Relationship status: Not on file  Other Topics Concern  . Not on file  Social History Narrative  . Not on file    Vitals:   05/15/17 0712  BP: 126/80  Pulse: 73  Resp: 12  Temp: 98.1  F (36.7 C)  SpO2: 98%   Body mass index is 24.98 kg/m.   Physical Exam  Nursing note and vitals reviewed. Constitutional: She is oriented to person, place, and time. She appears well-developed. No distress.  HENT:  Head: Normocephalic and atraumatic.  Mouth/Throat: Oropharynx is clear and moist and mucous membranes are normal.  Eyes: Pupils are equal, round, and reactive to light. Conjunctivae are normal.  Cardiovascular: Normal rate and regular rhythm.  No murmur heard. Pulses:      Dorsalis pedis pulses are 2+ on the right side, and 2+ on the left side.  Respiratory: Effort normal and breath sounds normal. No respiratory distress.  GI: Soft. She exhibits no mass. There is no hepatomegaly. There is no tenderness.  Musculoskeletal: She exhibits no edema.  Lymphadenopathy:    She has no cervical adenopathy.  Neurological: She is alert and oriented to person, place, and time. She has normal strength. Gait normal.  Skin: Skin is warm. No erythema.  Psychiatric: She has a normal mood and affect.  Well groomed, good eye contact.     ASSESSMENT AND PLAN:   [name] was seen today for 6 months follow-up.  Orders Placed This Encounter  Procedures  . Basic metabolic panel   Lab Results  Component Value Date   CREATININE 0.84 05/15/2017   BUN 9 05/15/2017   NA 135 05/15/2017   K 4.4 05/15/2017   CL 101 05/15/2017   CO2 27 05/15/2017    Anxiety disorder, unspecified Stable. No changes on Lorazepam 0.5 mg daily as needed, which she she does not take frequent and still has 1 refill left from her last prescription (01/2017). She will need a refill and 08/2017 before [place] beach to stay with her son's family from August to November.  Hyponatremia Continue fluid restriction. We discussed some side effects of current antihypertensive medication, both could aggravate problem. Further recommendation will be given according to lab results.  Essential  hypertension, benign Adequately controlled. No changes in current management. Low-salt diet recommended. Eye exam is current, next appointment next week. F/U in 6 months, before if needed.    -[name] was advised to return sooner than planned today if new concerns arise.       Aarron Wierzbicki G. Martinique, MD  [facility]. [place] office.

## 2017-05-14 NOTE — Telephone Encounter (Signed)
Spoke with patient and husband and they stated that she will need some in a few weeks, but not right now. Informed them that because she hasn't been seen since August 2018, she will need to make an appointment before medication ran out so that she can get it refilled. Patient verbalized understanding.

## 2017-05-15 ENCOUNTER — Encounter: Payer: Self-pay | Admitting: Family Medicine

## 2017-05-15 ENCOUNTER — Ambulatory Visit: Payer: Medicare Other | Admitting: Family Medicine

## 2017-05-15 VITALS — BP 126/80 | HR 73 | Temp 98.1°F | Resp 12 | Ht 64.0 in | Wt 145.5 lb

## 2017-05-15 DIAGNOSIS — E871 Hypo-osmolality and hyponatremia: Secondary | ICD-10-CM

## 2017-05-15 DIAGNOSIS — I10 Essential (primary) hypertension: Secondary | ICD-10-CM

## 2017-05-15 DIAGNOSIS — F419 Anxiety disorder, unspecified: Secondary | ICD-10-CM | POA: Diagnosis not present

## 2017-05-15 LAB — BASIC METABOLIC PANEL
BUN: 9 mg/dL (ref 6–23)
CHLORIDE: 101 meq/L (ref 96–112)
CO2: 27 meq/L (ref 19–32)
Calcium: 9.5 mg/dL (ref 8.4–10.5)
Creatinine, Ser: 0.84 mg/dL (ref 0.40–1.20)
GFR: 70.85 mL/min (ref >60.00)
GLUCOSE: 99 mg/dL (ref 70–99)
POTASSIUM: 4.4 meq/L (ref 3.5–5.1)
Sodium: 135 mEq/L (ref 135–145)

## 2017-05-15 MED ORDER — LISINOPRIL-HYDROCHLOROTHIAZIDE 20-12.5 MG PO TABS: 1.0000 | ORAL_TABLET | Freq: Every day | ORAL | 2 refills | Status: DC

## 2017-05-15 NOTE — Assessment & Plan Note (Signed)
Stable. No changes on Lorazepam 0.5 mg daily as needed, which she she does not take frequent and still has 1 refill left from her last prescription (01/2017). She will need a refill and 08/2017 before she leaves to the beach to stay with her son's family from August to November.

## 2017-05-15 NOTE — Assessment & Plan Note (Signed)
Adequately controlled. No changes in current management. Low-salt diet recommended. Eye exam is current, next appointment next week. F/U in 6 months, before if needed.

## 2017-05-15 NOTE — Assessment & Plan Note (Signed)
Continue fluid restriction. We discussed some side effects of current antihypertensive medication, both could aggravate problem. Further recommendation will be given according to lab results.

## 2017-08-27 ENCOUNTER — Other Ambulatory Visit: Payer: Self-pay | Admitting: Family Medicine

## 2017-08-27 DIAGNOSIS — G4700 Insomnia, unspecified: Secondary | ICD-10-CM

## 2017-09-06 ENCOUNTER — Ambulatory Visit
Admission: RE | Admit: 2017-09-06 | Discharge: 2017-09-06 | Disposition: A | Discharge status: Home / Self Care | Payer: Medicare Other | Source: Ambulatory Visit | Attending: Obstetrics & Gynecology | Admitting: Obstetrics & Gynecology

## 2017-09-06 DIAGNOSIS — Z1231 Encounter for screening mammogram for malignant neoplasm of breast: Secondary | ICD-10-CM

## 2018-01-20 ENCOUNTER — Encounter: Payer: Self-pay | Admitting: Family Medicine

## 2018-01-20 ENCOUNTER — Ambulatory Visit: Payer: Medicare Other | Admitting: Family Medicine

## 2018-01-20 VITALS — BP 130/78 | HR 96 | Temp 98.3°F | Resp 12 | Ht 64.0 in | Wt 139.1 lb

## 2018-01-20 DIAGNOSIS — I10 Essential (primary) hypertension: Secondary | ICD-10-CM | POA: Diagnosis not present

## 2018-01-20 DIAGNOSIS — E785 Hyperlipidemia, unspecified: Secondary | ICD-10-CM | POA: Diagnosis not present

## 2018-01-20 DIAGNOSIS — Z23 Encounter for immunization: Secondary | ICD-10-CM | POA: Diagnosis not present

## 2018-01-20 DIAGNOSIS — Z0000 Encounter for general adult medical examination without abnormal findings: Secondary | ICD-10-CM | POA: Diagnosis not present

## 2018-01-20 LAB — COMPREHENSIVE METABOLIC PANEL WITH GFR
ALT: 14 U/L (ref 0–35)
AST: 16 U/L (ref 0–37)
Albumin: 4.5 g/dL (ref 3.5–5.2)
Alkaline Phosphatase: 59 U/L (ref 39–117)
BUN: 13 mg/dL (ref 6–23)
CO2: 27 meq/L (ref 19–32)
Calcium: 9.8 mg/dL (ref 8.4–10.5)
Chloride: 98 meq/L (ref 96–112)
Creatinine, Ser: 0.88 mg/dL (ref 0.40–1.20)
GFR: 67.02 mL/min
Glucose, Bld: 97 mg/dL (ref 70–99)
Potassium: 4.6 meq/L (ref 3.5–5.1)
Sodium: 134 meq/L — ABNORMAL LOW (ref 135–145)
Total Bilirubin: 0.6 mg/dL (ref 0.2–1.2)
Total Protein: 6.8 g/dL (ref 6.0–8.3)

## 2018-01-20 LAB — LIPID PANEL
Cholesterol: 208 mg/dL — ABNORMAL HIGH (ref 0–200)
HDL: 68.3 mg/dL (ref >39.00)
LDL Cholesterol: 117 mg/dL — ABNORMAL HIGH (ref 0–99)
NonHDL: 140.12
TRIGLYCERIDES: 118 mg/dL (ref 0.0–149.0)
Total CHOL/HDL Ratio: 3
VLDL: 23.6 mg/dL (ref 0.0–40.0)

## 2018-01-20 NOTE — Patient Instructions (Signed)
A few things to remember from today's visit:   Routine general medical examination at a health care facility  Essential hypertension, benign - Plan: Comprehensive metabolic panel  Hyperlipidemia, unspecified hyperlipidemia type - Plan: Lipid panel  Medicare annual wellness visit, subsequent   A few tips:  -As we age balance is not as good as it was, so there is a higher risks for falls. Please remove small rugs and furniture that is "in your way" and could increase the risk of falls. Stretching exercises may help with fall prevention: Yoga and Tai Chi are some examples. Low impact exercise is better, so you are not very achy the next day.  -Sun screen and avoidance of direct sun light recommended. Caution with dehydration, if working outdoors be sure to drink enough fluids.  - Some medications are not safe as we age, increases the risk of side effects and can potentially interact with other medication you are also taken;  including some of over the counter medications. Be sure to let me know when you start a new medication even if it is a dietary/vitamin supplement.   -Healthy diet low in red meet/animal fat and sugar + regular physical activity is recommended.       Screening schedule for the next 5-10 years:  Colonoscopy due in 05/2018. DEXA with your next mammogram. Last pneumonia vaccine given today. Glaucoma screening/eye exam every 1-2 years.  Mammogram for breast cancer screening annually.  Diabetes screening   Fall prevention   Calcium supplementation ideally through your diet (1000-1200 mg) and vit D 800 U daily   Please be sure medication list is accurate. If a new problem present, please set up appointment sooner than planned today.

## 2018-01-20 NOTE — Progress Notes (Signed)
HPI:   Nicole [name] is a 72 y.o. female, who is here today for her routine physical and Medicare preventive visit.  Last CPE: 08/28/16. Last AWV 01/08/12.  Regular exercise 3 or more time per week: She walks daily when she goes to the beach, tried to walk a few times during the week when she is in town.Planning on going back to swimming. Following a healthy diet: Yes. She and her husband spend time with grandchildren during summer, time she enjoys. She lives with her husband.   Independent ADL's and IADL's. No falls in the past year and denies depression symptoms.  Functional Status Survey: Is the patient deaf or have difficulty hearing?: No Does the patient have difficulty seeing, even when wearing glasses/contacts?: No Does the patient have difficulty concentrating, remembering, or making decisions?: No Does the patient have difficulty walking or climbing stairs?: No Does the patient have difficulty dressing or bathing?: No Does the patient have difficulty doing errands alone such as visiting a doctor's office or shopping?: No  Fall Risk  01/20/2018 09/22/2015 05/11/2014  Falls in the past year? 0 No No  Number falls in past yr: 0 - -  Injury with Fall? 0 - -  Follow up Education provided - -     Providers she sees regularly: Eye care provider: Dr Oswald Hillock, Dr Benjie Karvonen Urologist,Dr Many.    Depression screen [facility] 2/9 01/20/2018  Decreased Interest 0  Down, Depressed, Hopeless 0  PHQ - 2 Score 0    Mini-Cog - 01/20/18 0939    Normal clock drawing test?  yes    How many words correct?  3       Visual Acuity Screening   Right eye Left eye Both eyes  Without correction:     With correction: 20/40 20/40 20/30     Chronic medical problems: Anxiety, she is on Xanax 0.5 mg daily as needed.She takes Xanax when travelling anf when she has difficulty to sleep. Recurrent UTI, follows with urologist prn.  HTN: She is on Lisinopril-HCTZ 20-12,5 mg daily. She  does not check BP at home. Eye exam is current. Denies severe/frequent headache, visual changes, chest pain, dyspnea, palpitation, claudication, focal weakness, or edema. HLD on Simvastatin 20 mg daily. Tolerating medications well.   Immunization History  Administered Date(s) Administered  . Pneumococcal Conjugate-13 08/28/2016  . Pneumococcal Polysaccharide-23 05/21/2008  . Td 10/23/2005, 09/07/2009    Mammogram: 08/2017. Colonoscopy: 04/2008.  According to patient 10-year follow-up was recommended. DEXA: Many years ago, reported as normal.  Hep C screening: 08/2016 NR  She has no concerns today.   Review of Systems  Constitutional: Negative for activity change, appetite change, fatigue and fever.  HENT: Negative for mouth sores, nosebleeds, sore throat and trouble swallowing.   Eyes: Negative for redness and visual disturbance.  Respiratory: Negative for cough, shortness of breath and wheezing.   Cardiovascular: Negative for chest pain, palpitations and leg swelling.  Gastrointestinal: Negative for abdominal pain, nausea and vomiting.       Negative for changes in bowel habits.  Endocrine: Negative for polydipsia, polyphagia and polyuria.  Genitourinary: Negative for decreased urine volume, dysuria and hematuria.  Musculoskeletal: Positive for arthralgias. Negative for gait problem and myalgias.  Skin: Negative for color change and rash.  Neurological: Negative for syncope, weakness and headaches.  Psychiatric/Behavioral: Positive for sleep disturbance. Negative for confusion. The patient is nervous/anxious.       Current Outpatient Medications on File Prior to Visit  Medication Sig Dispense Refill  . aspirin 81 MG tablet Take 81 mg by mouth daily.     Marland Kitchen lisinopril-hydrochlorothiazide (ZESTORETIC) 20-12.5 MG tablet Take 1 tablet by mouth daily. 90 tablet 2  . LORazepam (ATIVAN) 0.5 MG tablet TAKE 1/2 TO 1 TABLET BY MOUTH DAILY AS NEEDED FOR ANXIETY 30 tablet 1  . Multiple  Vitamin (MULTIVITAMIN WITH MINERALS) TABS Take 1 tablet by mouth daily.    . simvastatin (ZOCOR) 20 MG tablet TAKE 1/2 A TABLET BY MOUTH THREE TIMES A WEEK ON MON, WED, AND SAT 50 tablet 2   No current facility-administered medications on file prior to visit.      Past Medical History:  Diagnosis Date  . Allergic rhinitis   . Dislocated shoulder    right   . Flying phobia    General anxieety also-xanax prn  . Hematuria    asymptomatic  . Hx of colonic polyps   . Hypertension     Past Surgical History:  Procedure Laterality Date  . HERNIA REPAIR  1999   Dr. Lara Mulch  . LAPAROSCOPY Right 08/07/2012   Procedure: LAPAROSCOPY OPERATIVE;  Surgeon: Elveria Royals, MD;  Location: [facility];  Service: Gynecology;  Laterality: Right;  Removal Right Ovarian Cyst Wall, Pelvic Washings    Allergies  Allergen Reactions  . Oysters [Shellfish Allergy] Swelling    Swelling of lips next day after eating oysters  . Penicillins Hives  . Sulfamethoxazole-Trimethoprim Other (See Comments)    REACTION: turned firey red    Family History  Problem Relation Age of Onset  . Hypertension Mother   . Osteoporosis Mother   . Lymphoma Father   . Breast cancer Maternal Grandmother     Social History   Socioeconomic History  . Marital status: Married    Spouse name: Not on file  . Number of children: Not on file  . Years of education: Not on file  . Highest education level: Not on file  Occupational History  . Not on file  Social Needs  . Financial resource strain: Not on file  . Food insecurity:    Worry: Not on file    Inability: Not on file  . Transportation needs:    Medical: Not on file    Non-medical: Not on file  Tobacco Use  . Smoking status: Former Research scientist (life sciences)  . Smokeless tobacco: Never Used  Substance and Sexual Activity  . Alcohol use: Yes    Comment: Occasionnally-social  . Drug use: No  . Sexual activity: Not on file  Lifestyle  . Physical activity:    Days  per week: Not on file    Minutes per session: Not on file  . Stress: Not on file  Relationships  . Social connections:    Talks on phone: Not on file    Gets together: Not on file    Attends religious service: Not on file    Active member of club or organization: Not on file    Attends meetings of clubs or organizations: Not on file    Relationship status: Not on file  Other Topics Concern  . Not on file  Social History Narrative  . Not on file     Vitals:   01/20/18 0855  BP: 130/78  Pulse: 96  Resp: 12  Temp: 98.3 F (36.8 C)  SpO2: 98%   Body mass index is 23.88 kg/m.   Wt Readings from Last 3 Encounters:  01/20/18 139 lb 2 oz (63.1 kg)  05/15/17 145 lb 8 oz (66 kg)  08/28/16 150 lb 2 oz (68.1 kg)     Physical Exam  Nursing note and vitals reviewed. Constitutional: She is oriented to person, place, and time. She appears well-developed and well-nourished. No distress.  HENT:  Head: Normocephalic and atraumatic.  Mouth/Throat: Oropharynx is clear and moist and mucous membranes are normal.  Eyes: Pupils are equal, round, and reactive to light. Conjunctivae are normal.  Cardiovascular: Normal rate and regular rhythm.  No murmur heard. Pulses:      Dorsalis pedis pulses are 2+ on the right side and 2+ on the left side.  Respiratory: Effort normal and breath sounds normal. No respiratory distress.  GI: Soft. She exhibits no mass. There is no hepatomegaly. There is no abdominal tenderness.  Musculoskeletal:        General: No edema.  Lymphadenopathy:    She has no cervical adenopathy.  Neurological: She is alert and oriented to person, place, and time. She has normal strength. No cranial nerve deficit. Gait normal.  Reflex Scores:      Bicep reflexes are 2+ on the right side and 2+ on the left side.      Patellar reflexes are 2+ on the right side and 2+ on the left side. Skin: Skin is warm. No rash noted. No erythema.  Psychiatric: She has a normal mood and  affect.  Well groomed, good eye contact.      ASSESSMENT AND PLAN:  Ms. JAINE ESTABROOKS was here today annual physical examination and AWV.   Orders Placed This Encounter  Procedures  . Pneumococcal polysaccharide vaccine 23-valent greater than or equal to 2yo subcutaneous/IM  . Comprehensive metabolic panel  . Lipid panel   Lab Results  Component Value Date   CHOL 208 (H) 01/20/2018   HDL 68.30 01/20/2018   LDLCALC 117 (H) 01/20/2018   LDLDIRECT 172.5 01/01/2012   TRIG 118.0 01/20/2018   CHOLHDL 3 01/20/2018   Lab Results  Component Value Date   ALT 14 01/20/2018   AST 16 01/20/2018   ALKPHOS 59 01/20/2018   BILITOT 0.6 01/20/2018   Lab Results  Component Value Date   CREATININE 0.88 01/20/2018   BUN 13 01/20/2018   NA 134 (L) 01/20/2018   K 4.6 01/20/2018   CL 98 01/20/2018   CO2 27 01/20/2018    Routine general medical examination at a health care facility We discussed the importance of regular physical activity and healthy diet for prevention of chronic illness and/or complications. Preventive guidelines reviewed.  Ca++ and vit D supplementation recommended. Next CPE in a year.  Medicare annual wellness visit, subsequent We discussed the importance of staying active, physically and mentally, as well as the benefits of a healthy/balance diet. Low impact exercise that involve stretching and strengthing are ideal. Vaccines updated. We discussed preventive screening for the next 5-10 years, summery of recommendations given in AVS:  Colonoscopy due in 05/2018. DEXA with your next mammogram. Last pneumonia vaccine given today. Glaucoma screening/eye exam every 1-2 years.  Mammogram for breast cancer screening annually.  Diabetes screening   Fall prevention  Advance directives and end of life discussed, POA is her husband.    Essential hypertension, benign Adequately controlled. No changes in current management.  -     Comprehensive metabolic  panel  Hyperlipidemia, unspecified hyperlipidemia type No changes in current management, will follow labs done today and will give further recommendations accordingly.  -     Lipid panel  Need  for pneumococcal vaccination -     Pneumococcal polysaccharide vaccine 23-valent greater than or equal to 2yo subcutaneous/IM     Return in 6 months (on 07/22/2018) for HTN.      Lynwood Kubisiak G. Martin[place], MD  [facility]. [place] office.

## 2018-01-21 ENCOUNTER — Encounter: Payer: Self-pay | Admitting: Family Medicine

## 2018-02-17 ENCOUNTER — Other Ambulatory Visit: Payer: Self-pay | Admitting: Family Medicine

## 2018-02-17 DIAGNOSIS — E785 Hyperlipidemia, unspecified: Secondary | ICD-10-CM

## 2018-02-17 DIAGNOSIS — G4700 Insomnia, unspecified: Secondary | ICD-10-CM

## 2018-09-06 ENCOUNTER — Other Ambulatory Visit: Payer: Self-pay | Admitting: Family Medicine

## 2018-09-06 DIAGNOSIS — G4700 Insomnia, unspecified: Secondary | ICD-10-CM

## 2018-10-20 ENCOUNTER — Other Ambulatory Visit: Payer: Self-pay | Admitting: Family Medicine

## 2018-11-18 ENCOUNTER — Other Ambulatory Visit: Payer: Self-pay | Admitting: Family Medicine

## 2019-01-04 ENCOUNTER — Other Ambulatory Visit: Payer: Self-pay | Admitting: Family Medicine

## 2019-01-21 ENCOUNTER — Encounter: Payer: Medicare Other | Admitting: Family Medicine

## 2019-02-16 ENCOUNTER — Ambulatory Visit: Payer: Medicare PPO | Attending: Internal Medicine

## 2019-02-16 DIAGNOSIS — Z23 Encounter for immunization: Secondary | ICD-10-CM | POA: Insufficient documentation

## 2019-02-16 NOTE — Progress Notes (Signed)
   Covid-19 Vaccination Clinic  Name:  Nicole [name]    MRN: [id number] DOB: May 26, 1945  02/16/2019  Nicole [name] was observed post Covid-19 immunization for 15 minutes without incidence. She was provided with Vaccine Information Sheet and instruction to access the V-Safe system.   Nicole [name] was instructed to call 911 with any severe reactions post vaccine: Marland Kitchen Difficulty breathing  . Swelling of your face and throat  . A fast heartbeat  . A bad rash all over your body  . Dizziness and weakness    Immunizations Administered    Name Date Dose VIS Date Route   Pfizer COVID-19 Vaccine 02/16/2019  4:37 PM 0.3 mL 01/02/2019 Intramuscular   Manufacturer: [place]   Lot: [id number]   [place]: [id number]

## 2019-02-23 ENCOUNTER — Encounter: Payer: Medicare Other | Admitting: Family Medicine

## 2019-02-25 ENCOUNTER — Other Ambulatory Visit: Payer: Self-pay | Admitting: Family Medicine

## 2019-03-09 ENCOUNTER — Ambulatory Visit: Payer: Medicare PPO | Attending: Internal Medicine

## 2019-03-09 DIAGNOSIS — Z23 Encounter for immunization: Secondary | ICD-10-CM

## 2019-03-09 NOTE — Progress Notes (Signed)
   Covid-19 Vaccination Clinic  Name:  Nicole [name]    MRN: [id number] DOB: 12-26-1945  03/09/2019  Nicole [name] was observed post Covid-19 immunization for 15 minutes without incidence. She was provided with Vaccine Information Sheet and instruction to access the V-Safe system.   Nicole [name] was instructed to call 911 with any severe reactions post vaccine: Marland Kitchen Difficulty breathing  . Swelling of your face and throat  . A fast heartbeat  . A bad rash all over your body  . Dizziness and weakness    Immunizations Administered    Name Date Dose VIS Date Route   Pfizer COVID-19 Vaccine 03/09/2019 12:38 PM 0.3 mL 01/02/2019 Intramuscular   Manufacturer: Icard   Lot: [id number]   [place]: [id number]

## 2019-03-11 ENCOUNTER — Other Ambulatory Visit: Payer: Self-pay | Admitting: Family Medicine

## 2019-03-11 DIAGNOSIS — G4700 Insomnia, unspecified: Secondary | ICD-10-CM

## 2019-03-13 NOTE — Telephone Encounter (Signed)
Last filled 01/12/19 

## 2019-04-14 ENCOUNTER — Other Ambulatory Visit: Payer: Self-pay

## 2019-04-15 ENCOUNTER — Ambulatory Visit (INDEPENDENT_AMBULATORY_CARE_PROVIDER_SITE_OTHER): Payer: Medicare PPO | Admitting: Family Medicine

## 2019-04-15 ENCOUNTER — Encounter: Payer: Self-pay | Admitting: Family Medicine

## 2019-04-15 ENCOUNTER — Other Ambulatory Visit: Payer: Self-pay | Admitting: Obstetrics & Gynecology

## 2019-04-15 VITALS — BP 124/70 | HR 60 | Resp 12 | Ht 64.0 in | Wt 129.0 lb

## 2019-04-15 DIAGNOSIS — F419 Anxiety disorder, unspecified: Secondary | ICD-10-CM | POA: Diagnosis not present

## 2019-04-15 DIAGNOSIS — E785 Hyperlipidemia, unspecified: Secondary | ICD-10-CM

## 2019-04-15 DIAGNOSIS — Z0000 Encounter for general adult medical examination without abnormal findings: Secondary | ICD-10-CM | POA: Diagnosis not present

## 2019-04-15 DIAGNOSIS — Z780 Asymptomatic menopausal state: Secondary | ICD-10-CM

## 2019-04-15 DIAGNOSIS — I10 Essential (primary) hypertension: Secondary | ICD-10-CM | POA: Diagnosis not present

## 2019-04-15 DIAGNOSIS — Z1231 Encounter for screening mammogram for malignant neoplasm of breast: Secondary | ICD-10-CM

## 2019-04-15 LAB — COMPREHENSIVE METABOLIC PANEL
ALT: 12 U/L (ref 0–35)
AST: 16 U/L (ref 0–37)
Albumin: 4.6 g/dL (ref 3.5–5.2)
Alkaline Phosphatase: 52 U/L (ref 39–117)
BUN: 12 mg/dL (ref 6–23)
CO2: 27 mEq/L (ref 19–32)
Calcium: 9.6 mg/dL (ref 8.4–10.5)
Chloride: 97 mEq/L (ref 96–112)
Creatinine, Ser: 0.81 mg/dL (ref 0.40–1.20)
GFR: 69.15 mL/min (ref >60.00)
Glucose, Bld: 107 mg/dL — ABNORMAL HIGH (ref 70–99)
Potassium: 4.1 mEq/L (ref 3.5–5.1)
Sodium: 132 mEq/L — ABNORMAL LOW (ref 135–145)
Total Bilirubin: 0.7 mg/dL (ref 0.2–1.2)
Total Protein: 6.9 g/dL (ref 6.0–8.3)

## 2019-04-15 LAB — LIPID PANEL
Cholesterol: 204 mg/dL — ABNORMAL HIGH (ref 0–200)
HDL: 72.9 mg/dL (ref >39.00)
LDL Cholesterol: 106 mg/dL — ABNORMAL HIGH (ref 0–99)
NonHDL: 130.63
Total CHOL/HDL Ratio: 3
Triglycerides: 122 mg/dL (ref 0.0–149.0)
VLDL: 24.4 mg/dL (ref 0.0–40.0)

## 2019-04-15 LAB — TSH: TSH: 0.92 u[IU]/mL (ref 0.35–4.50)

## 2019-04-15 MED ORDER — LISINOPRIL-HYDROCHLOROTHIAZIDE 20-12.5 MG PO TABS: 1.0000 | ORAL_TABLET | Freq: Every day | ORAL | 3 refills | Status: DC

## 2019-04-15 NOTE — Assessment & Plan Note (Signed)
Continue simvastatin 20 mg daily 1/2 tablet 3 times per week and low-fat diet. Further recommendation will be given according to lipid panel results.

## 2019-04-15 NOTE — Assessment & Plan Note (Signed)
Problem is otherwise stable. Continue lorazepam 0.5 mg daily as needed. Continue following annually unless problems gets worse.

## 2019-04-15 NOTE — Assessment & Plan Note (Signed)
BP adequately controlled. No changes in current management. Continue low-salt diet. Checking BP exacerbates anxiety. She prefers to follow annually.

## 2019-04-15 NOTE — Patient Instructions (Addendum)
  Nicole [name] , Thank you for taking time to come for your Medicare Wellness Visit. I appreciate your ongoing commitment to your health goals. Please review the following plan we discussed and let me know if I can assist you in the future.   These are the goals we discussed: Goals   None Continue regular walking and eating at home as much as possible. You lost some wt, I think related to dietary changes and exercise.     This is a list of the screening recommended for you and due dates:  Health Maintenance  Topic Date Due  . DEXA scan (bone density measurement)  Never done  . Colon Cancer Screening  05/22/2018  . Mammogram  09/07/2019  . Tetanus Vaccine  09/08/2019  .  Hepatitis C: One time screening is recommended by Center for Disease Control  (CDC) for  adults born from [birth year] through 1965.   Completed  . Pneumonia vaccines  Completed  . Flu Shot  Discontinued   A few tips:  -As we age balance is not as good as it was, so there is a higher risks for falls. Please remove small rugs and furniture that is "in your way" and could increase the risk of falls. Stretching exercises may help with fall prevention: Yoga and Tai Chi are some examples. Low impact exercise is better, so you are not very achy the next day.  -Sun screen and avoidance of direct sun light recommended. Caution with dehydration, if working outdoors be sure to drink enough fluids.  - Some medications are not safe as we age, increases the risk of side effects and can potentially interact with other medication you are also taken;  including some of over the counter medications. Be sure to let me know when you start a new medication even if it is a dietary/vitamin supplement.   -Healthy diet low in red meet/animal fat and sugar + regular physical activity is recommended.

## 2019-04-15 NOTE — Progress Notes (Signed)
HPI:   Nicole [name] is a 74 y.o. female, who is here today for her routine physical and AWV.  Last AWV on 01/20/18. She lives with her husband. No new problems since her last visit.  Regular exercise 3 or more time per week: She is walking at least 5 times per week for 20 to 40 minutes, depending on the weather. Following a healthy diet: She is now cooking at home most of the time. Independent ADL's and IADL's.  Functional Status Survey: Is the patient deaf or have difficulty hearing?: No Does the patient have difficulty seeing, even when wearing glasses/contacts?: No Does the patient have difficulty concentrating, remembering, or making decisions?: No Does the patient have difficulty walking or climbing stairs?: No Does the patient have difficulty dressing or bathing?: No Does the patient have difficulty doing errands alone such as visiting a doctor's office or shopping?: No  She is able to drive but her husband usually does.  Fall Risk  04/15/2019 01/20/2018 09/22/2015 05/11/2014  Falls in the past year? 0 0 No No  Number falls in past yr: - 0 - -  Injury with Fall? - 0 - -  Follow up - Education provided - -   Providers she sees regularly: Eye care provider: Dr Eulas Post. Gyn: Dr Benjie Karvonen, she did not see provider in 2020, planning on arranging appt. Dermatologist: Dr Wynetta Emery, annually.  Depression screen [facility] 2/9 04/15/2019  Decreased Interest 0  Down, Depressed, Hopeless 0  PHQ - 2 Score 0   Mini-Cog - 04/15/19 1012    Normal clock drawing test?  yes    How many words correct?  3       Hearing Screening   125Hz  250Hz  500Hz  1000Hz  2000Hz  3000Hz  4000Hz  6000Hz  8000Hz   Right ear:           Left ear:             Visual Acuity Screening   Right eye Left eye Both eyes  Without correction:     With correction: 20/50 20/50 20/40     Last CPE: 01/20/18 Chronic medical problems:  HTN: Currently on Lisinopril-HCTZ 20-12.5 mg daily. Not checking BP  regularly.  Component     Latest Ref Rng & Units 01/20/2018  Sodium     135 - 145 mEq/L 134 (L)  Potassium     3.5 - 5.1 mEq/L 4.6  Chloride     96 - 112 mEq/L 98  CO2     19 - 32 mEq/L 27  Glucose     70 - 99 mg/dL 97  BUN     6 - 23 mg/dL 13  Creatinine     0.40 - 1.20 mg/dL 0.88  Calcium     8.4 - 10.5 mg/dL 9.8  GFR     >60.00 mL/min 67.02    Anxiety: Takes Lorazepam 0.5 mg 1/2-1 tab daily as needed. She takes medication very seldom. Lorazepam also helps with sleep.  No depressed mood.  HLD:  On Simvastatin 20 mg daily.  Component     Latest Ref Rng & Units 01/20/2018  Cholesterol     0 - 200 mg/dL 208 (H)  Triglycerides     0.0 - 149.0 mg/dL 118.0  HDL Cholesterol     >39.00 mg/dL 68.30  VLDL     0.0 - 40.0 mg/dL 23.6  LDL (calc)     0 - 99 mg/dL 117 (H)  Total CHOL/HDL Ratio  3  NonHDL      140.12    Immunization History  Administered Date(s) Administered  . PFIZER SARS-COV-2 Vaccination 02/16/2019, 03/09/2019  . Pneumococcal Conjugate-13 08/28/2016  . Pneumococcal Polysaccharide-23 05/21/2008, 01/20/2018  . Td 10/23/2005, 09/07/2009   Mammogram: 08/2017. She has an appt 05/04/19. Colonoscopy: She is due for colonoscopy but prefers to wait until latter this year. DEXA: A few years ago, normal.  Hep C screening: 08/2016.  Currently she is anxious about weight loss since her last visit. Due to COVID-19 she is eating at home all the time and exercising regularly. Negative for fever, chills, night sweats, or unusual fatigue.  Review of Systems  Constitutional: Negative for activity change and appetite change.  HENT: Negative for mouth sores, nosebleeds and sore throat.   Eyes: Negative for redness and visual disturbance.  Respiratory: Negative for cough, shortness of breath and wheezing.   Cardiovascular: Negative for chest pain, palpitations and leg swelling.  Gastrointestinal: Negative for abdominal pain, nausea and vomiting.        Negative for changes in bowel habits.  Endocrine: Negative for cold intolerance, heat intolerance, polydipsia, polyphagia and polyuria.  Genitourinary: Negative for decreased urine volume, dysuria and hematuria.  Musculoskeletal: Positive for arthralgias. Negative for gait problem and myalgias.  Skin: Negative for pallor and rash.  Neurological: Negative for syncope, weakness and headaches.  Psychiatric/Behavioral: Positive for sleep disturbance. The patient is nervous/anxious.   All other systems reviewed and are negative.  Current Outpatient Medications on File Prior to Visit  Medication Sig Dispense Refill  . aspirin 81 MG tablet Take 81 mg by mouth daily.     Marland Kitchen LORazepam (ATIVAN) 0.5 MG tablet TAKE 1/2 TO 1 TABLET BY MOUTH DAILY AS NEEDED FOR ANXIETY 30 tablet 0  . Multiple Vitamin (MULTIVITAMIN WITH MINERALS) TABS Take 1 tablet by mouth daily.    . simvastatin (ZOCOR) 20 MG tablet TAKE 1/2 A TABLET BY MOUTH THREE TIMES A WEEK ON MON, WED, AND SAT 18 tablet 8   No current facility-administered medications on file prior to visit.   Past Medical History:  Diagnosis Date  . Allergic rhinitis   . Dislocated shoulder    right   . Flying phobia    General anxieety also-xanax prn  . Hematuria    asymptomatic  . Hx of colonic polyps   . Hypertension     Past Surgical History:  Procedure Laterality Date  . HERNIA REPAIR  1999   Dr. Lara Mulch  . LAPAROSCOPY Right 08/07/2012   Procedure: LAPAROSCOPY OPERATIVE;  Surgeon: Elveria Royals, MD;  Location: [facility];  Service: Gynecology;  Laterality: Right;  Removal Right Ovarian Cyst Wall, Pelvic Washings    Allergies  Allergen Reactions  . Oysters [Shellfish Allergy] Swelling    Swelling of lips next day after eating oysters  . Penicillins Hives  . Sulfamethoxazole-Trimethoprim Other (See Comments)    REACTION: turned firey red    Family History  Problem Relation Age of Onset  . Hypertension Mother   . Osteoporosis  Mother   . Lymphoma Father   . Breast cancer Maternal Grandmother     Social History   Socioeconomic History  . Marital status: Married    Spouse name: Not on file  . Number of children: Not on file  . Years of education: Not on file  . Highest education level: Not on file  Occupational History  . Not on file  Tobacco Use  . Smoking status: Former Research scientist (life sciences)  .  Smokeless tobacco: Never Used  Substance and Sexual Activity  . Alcohol use: Yes    Comment: Occasionnally-social  . Drug use: No  . Sexual activity: Not on file  Other Topics Concern  . Not on file  Social History Narrative  . Not on file   Social Determinants of Health   Financial Resource Strain:   . Difficulty of Paying Living Expenses:   Food Insecurity:   . Worried About Charity fundraiser in the Last Year:   . Arboriculturist in the Last Year:   Transportation Needs:   . Film/video editor (Medical):   Marland Kitchen Lack of Transportation (Non-Medical):   Physical Activity:   . Days of Exercise per Week:   . Minutes of Exercise per Session:   Stress:   . Feeling of Stress :   Social Connections:   . Frequency of Communication with Friends and Family:   . Frequency of Social Gatherings with Friends and Family:   . Attends Religious Services:   . Active Member of Clubs or Organizations:   . Attends Archivist Meetings:   Marland Kitchen Marital Status:     Vitals:   04/15/19 0940  BP: 124/70  Pulse: 60  Resp: 12  SpO2: 98%   Body mass index is 22.14 kg/m.  Wt Readings from Last 3 Encounters:  04/15/19 129 lb (58.5 kg)  01/20/18 139 lb 2 oz (63.1 kg)  05/15/17 145 lb 8 oz (66 kg)    Physical Exam  Nursing note and vitals reviewed. Constitutional: She is oriented to person, place, and time. She appears well-developed and well-nourished. No distress.  HENT:  Head: Normocephalic and atraumatic.  Right Ear: Hearing, tympanic membrane, external ear and ear canal normal.  Left Ear: Hearing, tympanic  membrane, external ear and ear canal normal.  Mouth/Throat: Uvula is midline, oropharynx is clear and moist and mucous membranes are normal.  Eyes: Pupils are equal, round, and reactive to light. Conjunctivae and EOM are normal.  Neck: No tracheal deviation present.  Cardiovascular: Normal rate and regular rhythm.  No murmur heard. Pulses:      Dorsalis pedis pulses are 2+ on the right side and 2+ on the left side.  Respiratory: Effort normal and breath sounds normal. No respiratory distress.  GI: Soft. She exhibits no mass. There is no hepatomegaly. There is no abdominal tenderness.  Genitourinary:    Genitourinary Comments: Deferred to gyn.   Musculoskeletal:        General: No edema.     Comments: No major deformity or signs of synovitis appreciated.  Lymphadenopathy:    She has no cervical adenopathy.       Right: No supraclavicular adenopathy present.       Left: No supraclavicular adenopathy present.  Neurological: She is alert and oriented to person, place, and time. She has normal strength. No cranial nerve deficit. Coordination and gait normal.  Reflex Scores:      Bicep reflexes are 2+ on the right side and 2+ on the left side.      Patellar reflexes are 2+ on the right side and 2+ on the left side. Skin: Skin is warm. No rash noted. No erythema.  Psychiatric: Her speech is normal. Her mood appears anxious.  Well groomed, good eye contact.   ASSESSMENT AND PLAN:  Nicole [name] was here today annual physical examination.  Orders Placed This Encounter  Procedures  . DEXAScan  . Comprehensive metabolic panel  .  Lipid panel  . TSH   Lab Results  Component Value Date   TSH 0.92 04/15/2019   Lab Results  Component Value Date   ALT 12 04/15/2019   AST 16 04/15/2019   ALKPHOS 52 04/15/2019   BILITOT 0.7 04/15/2019   Lab Results  Component Value Date   CREATININE 0.81 04/15/2019   BUN 12 04/15/2019   NA 132 (L) 04/15/2019   K 4.1 04/15/2019   CL 97  04/15/2019   CO2 27 04/15/2019   Lab Results  Component Value Date   CHOL 204 (H) 04/15/2019   HDL 72.90 04/15/2019   LDLCALC 106 (H) 04/15/2019   LDLDIRECT 172.5 01/01/2012   TRIG 122.0 04/15/2019   CHOLHDL 3 04/15/2019    Routine general medical examination at a health care facility We discussed the importance of regular physical activity and healthy diet for prevention of chronic illness and/or complications. Preventive guidelines reviewed. Vaccination up to date.  Ca++ and vit D supplementation to continue. Next CPE in a year.  Asymptomatic postmenopausal estrogen deficiency -     DEXAScan; Future  Medicare annual wellness visit, subsequent We discussed the importance of staying active, physically and mentally, as well as the benefits of a healthy/balance diet. Low impact exercise that involve stretching and strengthing are ideal.  We discussed preventive screening for the next 5-10 years, summery of recommendations given in AVS. Fall prevention.  Advance directives and end of life discussed.She has a living will and POA.   Essential hypertension, benign BP adequately controlled. No changes in current management. Continue low-salt diet. Checking BP exacerbates anxiety. She prefers to follow annually.  Anxiety disorder, unspecified Problem is otherwise stable. Continue lorazepam 0.5 mg daily as needed. Continue following annually unless problems gets worse.  Hyperlipidemia Continue simvastatin 20 mg daily 1/2 tablet 3 times per week and low-fat diet. Further recommendation will be given according to lipid panel results.   Return in 1 year (on 04/14/2020) for CPE,AWV,and f/u.   Kamden Stanislaw G. Martinique, MD  [facility]. [place] office.  Nicole [name] , Thank you for taking time to come for your Medicare Wellness Visit. I appreciate your ongoing commitment to your health goals. Please review the following plan we discussed and let me know if I can assist you  in the future.   These are the goals we discussed: Goals   None Continue regular walking and eating at home as much as possible. You lost some wt, I think related to dietary changes and exercise.     This is a list of the screening recommended for you and due dates:  Health Maintenance  Topic Date Due  . DEXA scan (bone density measurement)  Never done  . Colon Cancer Screening  05/22/2018  . Mammogram  09/07/2019  . Tetanus Vaccine  09/08/2019  .  Hepatitis C: One time screening is recommended by Center for Disease Control  (CDC) for  adults born from [birth year] through 1965.   Completed  . Pneumonia vaccines  Completed  . Flu Shot  Discontinued   A few tips:  -As we age balance is not as good as it was, so there is a higher risks for falls. Please remove small rugs and furniture that is "in your way" and could increase the risk of falls. Stretching exercises may help with fall prevention: Yoga and Tai Chi are some examples. Low impact exercise is better, so you are not very achy the next day.  -Sun screen and avoidance of  direct sun light recommended. Caution with dehydration, if working outdoors be sure to drink enough fluids.  - Some medications are not safe as we age, increases the risk of side effects and can potentially interact with other medication you are also taken;  including some of over the counter medications. Be sure to let me know when you start a new medication even if it is a dietary/vitamin supplement.   -Healthy diet low in red meet/animal fat and sugar + regular physical activity is recommended.

## 2019-04-16 ENCOUNTER — Encounter: Payer: Self-pay | Admitting: Family Medicine

## 2019-05-04 ENCOUNTER — Other Ambulatory Visit: Payer: Self-pay

## 2019-05-04 ENCOUNTER — Ambulatory Visit
Admission: RE | Admit: 2019-05-04 | Discharge: 2019-05-04 | Disposition: A | Discharge status: Home / Self Care | Payer: Medicare PPO | Source: Ambulatory Visit | Attending: Obstetrics & Gynecology | Admitting: Obstetrics & Gynecology

## 2019-05-04 DIAGNOSIS — Z1231 Encounter for screening mammogram for malignant neoplasm of breast: Secondary | ICD-10-CM

## 2019-05-06 ENCOUNTER — Other Ambulatory Visit: Payer: Self-pay | Admitting: Family Medicine

## 2019-05-06 DIAGNOSIS — E785 Hyperlipidemia, unspecified: Secondary | ICD-10-CM

## 2019-05-11 DIAGNOSIS — Z85820 Personal history of malignant melanoma of skin: Secondary | ICD-10-CM | POA: Diagnosis not present

## 2019-05-11 DIAGNOSIS — D224 Melanocytic nevi of scalp and neck: Secondary | ICD-10-CM | POA: Diagnosis not present

## 2019-05-11 DIAGNOSIS — D2271 Melanocytic nevi of right lower limb, including hip: Secondary | ICD-10-CM | POA: Diagnosis not present

## 2019-05-11 DIAGNOSIS — L814 Other melanin hyperpigmentation: Secondary | ICD-10-CM | POA: Diagnosis not present

## 2019-05-11 DIAGNOSIS — D225 Melanocytic nevi of trunk: Secondary | ICD-10-CM | POA: Diagnosis not present

## 2019-05-11 DIAGNOSIS — D2272 Melanocytic nevi of left lower limb, including hip: Secondary | ICD-10-CM | POA: Diagnosis not present

## 2019-05-11 DIAGNOSIS — D692 Other nonthrombocytopenic purpura: Secondary | ICD-10-CM | POA: Diagnosis not present

## 2019-05-11 DIAGNOSIS — Z85828 Personal history of other malignant neoplasm of skin: Secondary | ICD-10-CM | POA: Diagnosis not present

## 2019-05-11 DIAGNOSIS — E755 Other lipid storage disorders: Secondary | ICD-10-CM | POA: Diagnosis not present

## 2019-05-26 ENCOUNTER — Other Ambulatory Visit: Payer: Self-pay | Admitting: Family Medicine

## 2019-05-26 DIAGNOSIS — G4700 Insomnia, unspecified: Secondary | ICD-10-CM

## 2019-06-08 DIAGNOSIS — D3132 Benign neoplasm of left choroid: Secondary | ICD-10-CM | POA: Diagnosis not present

## 2019-06-08 DIAGNOSIS — H02824 Cysts of left upper eyelid: Secondary | ICD-10-CM | POA: Diagnosis not present

## 2019-06-08 DIAGNOSIS — H02825 Cysts of left lower eyelid: Secondary | ICD-10-CM | POA: Diagnosis not present

## 2019-06-08 DIAGNOSIS — H25813 Combined forms of age-related cataract, bilateral: Secondary | ICD-10-CM | POA: Diagnosis not present

## 2019-06-30 ENCOUNTER — Other Ambulatory Visit: Payer: Self-pay

## 2019-06-30 ENCOUNTER — Ambulatory Visit
Admission: RE | Admit: 2019-06-30 | Discharge: 2019-06-30 | Disposition: A | Discharge status: Home / Self Care | Payer: Medicare PPO | Source: Ambulatory Visit | Attending: Family Medicine | Admitting: Family Medicine

## 2019-06-30 DIAGNOSIS — Z780 Asymptomatic menopausal state: Secondary | ICD-10-CM

## 2019-06-30 DIAGNOSIS — M85852 Other specified disorders of bone density and structure, left thigh: Secondary | ICD-10-CM | POA: Diagnosis not present

## 2019-09-01 ENCOUNTER — Ambulatory Visit: Payer: Medicare PPO | Admitting: Family Medicine

## 2019-09-08 ENCOUNTER — Encounter: Payer: Self-pay | Admitting: Family Medicine

## 2019-09-08 ENCOUNTER — Ambulatory Visit: Payer: Medicare PPO | Admitting: Family Medicine

## 2019-09-08 ENCOUNTER — Other Ambulatory Visit: Payer: Self-pay

## 2019-09-08 VITALS — BP 128/80 | HR 97 | Temp 98.3°F | Resp 16 | Ht 64.0 in | Wt 129.5 lb

## 2019-09-08 DIAGNOSIS — S8012XA Contusion of left lower leg, initial encounter: Secondary | ICD-10-CM
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
## 2019-10-23 ENCOUNTER — Other Ambulatory Visit: Payer: Self-pay | Admitting: Family Medicine

## 2019-10-23 DIAGNOSIS — G4700 Insomnia, unspecified: Secondary | ICD-10-CM

## 2019-12-10 ENCOUNTER — Other Ambulatory Visit: Payer: Medicare PPO

## 2019-12-10 DIAGNOSIS — Z20822 Contact with and (suspected) exposure to covid-19: Secondary | ICD-10-CM

## 2019-12-12 LAB — NOVEL CORONAVIRUS, NAA: SARS-CoV-2, NAA: NOT DETECTED

## 2019-12-12 LAB — SARS-COV-2, NAA 2 DAY TAT

## 2020-03-14 ENCOUNTER — Other Ambulatory Visit: Payer: Self-pay | Admitting: Family Medicine

## 2020-03-14 DIAGNOSIS — G4700 Insomnia, unspecified: Secondary | ICD-10-CM

## 2020-03-14 NOTE — Telephone Encounter (Signed)
Last filled 01/22/20 

## 2020-04-22 ENCOUNTER — Other Ambulatory Visit: Payer: Self-pay | Admitting: Family Medicine

## 2020-04-22 DIAGNOSIS — I10 Essential (primary) hypertension: Secondary | ICD-10-CM

## 2020-05-10 ENCOUNTER — Other Ambulatory Visit: Payer: Self-pay | Admitting: Family Medicine

## 2020-05-10 DIAGNOSIS — Z1231 Encounter for screening mammogram for malignant neoplasm of breast: Secondary | ICD-10-CM

## 2020-05-18 DIAGNOSIS — D225 Melanocytic nevi of trunk: Secondary | ICD-10-CM | POA: Diagnosis not present

## 2020-05-18 DIAGNOSIS — D2272 Melanocytic nevi of left lower limb, including hip: Secondary | ICD-10-CM | POA: Diagnosis not present

## 2020-05-18 DIAGNOSIS — Z85828 Personal history of other malignant neoplasm of skin: Secondary | ICD-10-CM | POA: Diagnosis not present

## 2020-05-18 DIAGNOSIS — D1801 Hemangioma of skin and subcutaneous tissue: Secondary | ICD-10-CM | POA: Diagnosis not present

## 2020-05-18 DIAGNOSIS — L570 Actinic keratosis: Secondary | ICD-10-CM | POA: Diagnosis not present

## 2020-05-18 DIAGNOSIS — Z85820 Personal history of malignant melanoma of skin: Secondary | ICD-10-CM | POA: Diagnosis not present

## 2020-05-18 DIAGNOSIS — L821 Other seborrheic keratosis: Secondary | ICD-10-CM | POA: Diagnosis not present

## 2020-05-18 DIAGNOSIS — E755 Other lipid storage disorders: Secondary | ICD-10-CM | POA: Diagnosis not present

## 2020-05-18 DIAGNOSIS — D2271 Melanocytic nevi of right lower limb, including hip: Secondary | ICD-10-CM | POA: Diagnosis not present

## 2020-05-24 ENCOUNTER — Other Ambulatory Visit: Payer: Self-pay

## 2020-05-24 ENCOUNTER — Ambulatory Visit (INDEPENDENT_AMBULATORY_CARE_PROVIDER_SITE_OTHER): Payer: Medicare PPO | Admitting: Family Medicine

## 2020-05-24 ENCOUNTER — Encounter: Payer: Self-pay | Admitting: Family Medicine

## 2020-05-24 VITALS — BP 126/80 | HR 79 | Temp 98.3°F | Resp 16 | Ht 64.0 in | Wt 127.4 lb

## 2020-05-24 DIAGNOSIS — J301 Allergic rhinitis due to pollen: Secondary | ICD-10-CM

## 2020-05-24 DIAGNOSIS — F99 Mental disorder, not otherwise specified: Secondary | ICD-10-CM

## 2020-05-24 DIAGNOSIS — R739 Hyperglycemia, unspecified: Secondary | ICD-10-CM

## 2020-05-24 DIAGNOSIS — Z0000 Encounter for general adult medical examination without abnormal findings: Secondary | ICD-10-CM

## 2020-05-24 DIAGNOSIS — H6122 Impacted cerumen, left ear: Secondary | ICD-10-CM

## 2020-05-24 DIAGNOSIS — I10 Essential (primary) hypertension: Secondary | ICD-10-CM | POA: Diagnosis not present

## 2020-05-24 DIAGNOSIS — F5105 Insomnia due to other mental disorder: Secondary | ICD-10-CM | POA: Diagnosis not present

## 2020-05-24 DIAGNOSIS — F419 Anxiety disorder, unspecified: Secondary | ICD-10-CM

## 2020-05-24 DIAGNOSIS — E785 Hyperlipidemia, unspecified: Secondary | ICD-10-CM | POA: Diagnosis not present

## 2020-05-24 DIAGNOSIS — G4700 Insomnia, unspecified: Secondary | ICD-10-CM

## 2020-05-24 LAB — HEMOGLOBIN A1C: Hgb A1c MFr Bld: 5.6 % (ref 4.6–6.5)

## 2020-05-24 LAB — LIPID PANEL
Cholesterol: 214 mg/dL — ABNORMAL HIGH (ref 0–200)
HDL: 70.9 mg/dL (ref >39.00)
LDL Cholesterol: 127 mg/dL — ABNORMAL HIGH (ref 0–99)
NonHDL: 143.28
Total CHOL/HDL Ratio: 3
Triglycerides: 82 mg/dL (ref 0.0–149.0)
VLDL: 16.4 mg/dL (ref 0.0–40.0)

## 2020-05-24 LAB — COMPREHENSIVE METABOLIC PANEL
ALT: 12 U/L (ref 0–35)
AST: 17 U/L (ref 0–37)
Albumin: 4.5 g/dL (ref 3.5–5.2)
Alkaline Phosphatase: 57 U/L (ref 39–117)
BUN: 9 mg/dL (ref 6–23)
CO2: 27 mEq/L (ref 19–32)
Calcium: 9.6 mg/dL (ref 8.4–10.5)
Chloride: 98 mEq/L (ref 96–112)
Creatinine, Ser: 0.88 mg/dL (ref 0.40–1.20)
GFR: 64.49 mL/min (ref >60.00)
Glucose, Bld: 103 mg/dL — ABNORMAL HIGH (ref 70–99)
Potassium: 4.3 mEq/L (ref 3.5–5.1)
Sodium: 133 mEq/L — ABNORMAL LOW (ref 135–145)
Total Bilirubin: 0.6 mg/dL (ref 0.2–1.2)
Total Protein: 7 g/dL (ref 6.0–8.3)

## 2020-05-24 MED ORDER — LORAZEPAM 0.5 MG PO TABS: 0.2500 mg | ORAL_TABLET | Freq: Every day | ORAL | 1 refills | Status: DC | PRN

## 2020-05-24 MED ORDER — DEBROX 6.5 % OT SOLN: 5.0000 [drp] | Freq: Two times a day (BID) | OTIC | 0 refills | Status: DC

## 2020-05-24 MED ORDER — FLUTICASONE PROPIONATE 50 MCG/ACT NA SUSP: 1.0000 | Freq: Two times a day (BID) | NASAL | 3 refills | Status: DC

## 2020-05-24 NOTE — Patient Instructions (Addendum)
A few things to remember from today's visit:   Routine general medical examination at a health care facility  Hyperlipidemia, unspecified hyperlipidemia type - Plan: Comprehensive metabolic panel, Lipid panel  Essential hypertension, benign - Plan: Comprehensive metabolic panel  Anxiety disorder, unspecified type  Hyperglycemia - Plan: Hemoglobin A1c  Insomnia, unspecified - Plan: LORazepam (ATIVAN) 0.5 MG tablet  Excessive cerumen in ear canal, left - Plan: carbamide peroxide (DEBROX) 6.5 % OTIC solution  Seasonal allergic rhinitis due to pollen - Plan: fluticasone (FLONASE) 50 MCG/ACT nasal spray  If you need refills please call your pharmacy. Do not use My Chart to request refills or for acute issues that need immediate attention.   Please let me know if you decide to have colonoscopy or other colon cancer screening test. Colonoscopy is the ideal, if normal it can be repeated every 10 years.  Immunochemical test (FIT): Hemoccult cards, very sensitive if all 3 cards are done. If negative it can be repeated annually. If positive a colonoscopy should follow.   Cologuard: Intended for patients with average risk factors for colon cancer from 27-54 years old. Anyone with prior history of colon polyps or family history of colonic neoplasia is NOT average risk. May be repeated every 3 years.  It is not as sensitive in older patients. Cost is around  $ 649. If positive a diagnostic colonoscopy must be done.   A few tips:  -As we age balance is not as good as it was, so there is a higher risks for falls. Please remove small rugs and furniture that is "in your way" and could increase the risk of falls. Stretching exercises may help with fall prevention: Yoga and Tai Chi are some examples. Low impact exercise is better, so you are not very achy the next day.  -Sun screen and avoidance of direct sun light recommended. Caution with dehydration, if working outdoors be sure to drink enough  fluids.  - Some medications are not safe as we age, increases the risk of side effects and can potentially interact with other medication you are also taken;  including some of over the counter medications. Be sure to let me know when you start a new medication even if it is a dietary/vitamin supplement.   -Healthy diet low in red meet/animal fat and sugar + regular physical activity is recommended.      Please be sure medication list is accurate. If a new problem present, please set up appointment sooner than planned today.

## 2020-05-24 NOTE — Progress Notes (Signed)
HPI: Nicole [name] is a 75 y.o. female, who is here today for her routine physical.  Last CPE: 04/15/19. Regular exercise 3 or more time per week: 20-30 min daily walking. Following a healthy diet: Yes, she cooks at home, take out q 1-2 weeks. She lives with her husband.  Chronic medical problems: Hypertension, anxiety, insomnia, hyperlipidemia, and skin cancer among some.  Immunization History  Administered Date(s) Administered  . PFIZER Comirnaty(Gray Top)Covid-19 Tri-Sucrose Vaccine 04/26/2020  . PFIZER(Purple Top)SARS-COV-2 Vaccination 02/16/2019, 03/09/2019  . Pneumococcal Conjugate-13 08/28/2016  . Pneumococcal Polysaccharide-23 05/21/2008, 01/20/2018  . Td 10/23/2005, 09/07/2009   Mammogram: 05/04/19, has appt in 06/2020. Colonoscopy: 06/2004,  She does not want colonoscopy at this time. DEXA: 06/30/19, osteopenia. She does not take Ca++ and vit D supplementation.  FRAX score:  Major Osteoporotic Fracture: 9.3% Hip Fracture:1.5% Hep C screening: 08/28/16.  Concerns today: Rhinorrhea and post nasal drainage in am. Happens almost annually, she she thinks it may be allergies. She has done COVID 19 testing at home last week and yesterday, negative. Negative for fever,fatigue,anosmia,or ageusia. No sick contact. 2nd COVID 19 booster on 04/26/20.  HLD:She is on Simvastatin 20 mg daily. Tolerating medication well, no side effects.  Lab Results  Component Value Date   CHOL 204 (H) 04/15/2019   HDL 72.90 04/15/2019   LDLCALC 106 (H) 04/15/2019   LDLDIRECT 172.5 01/01/2012   TRIG 122.0 04/15/2019   CHOLHDL 3 04/15/2019   HTN: She is on Lisinopril-HCTZ 20-12.5 mg daily. Negative for visual changes, CP,SOB,or focal neurologic deficit.  Lab Results  Component Value Date   CREATININE 0.81 04/15/2019   BUN 12 04/15/2019   NA 132 (L) 04/15/2019   K 4.1 04/15/2019   CL 97 04/15/2019   CO2 27 04/15/2019   Anxiety: She is on Lorazepam 0.5 mg 1/2-1 tab daily as needed.  Usually takes medication when traveling and for sleep. 30 tabs with 1 refill usually last a year. Negative for depressed mood.  No hx of DM, her glucose has been mildly elevated in the past.   Review of Systems  Constitutional: Negative for activity change, appetite change and chills.  HENT: Negative for hearing loss, mouth sores and sore throat.   Eyes: Negative for discharge and redness.  Respiratory: Negative for cough, shortness of breath and wheezing.   Cardiovascular: Negative for chest pain and leg swelling.  Gastrointestinal: Negative for abdominal pain, nausea and vomiting.       No changes in bowel habits.  Endocrine: Negative for cold intolerance, heat intolerance, polydipsia, polyphagia and polyuria.  Genitourinary: Negative for decreased urine volume, dysuria, hematuria, vaginal bleeding and vaginal discharge.  Musculoskeletal: Negative for gait problem and myalgias.  Skin: Negative for color change and rash.  Allergic/Immunologic: Positive for environmental allergies.  Neurological: Negative for syncope, weakness and headaches.  Hematological: Negative for adenopathy. Does not bruise/bleed easily.  Psychiatric/Behavioral: Positive for sleep disturbance. Negative for confusion. The patient is nervous/anxious.   All other systems reviewed and are negative.  Current Outpatient Medications on File Prior to Visit  Medication Sig Dispense Refill  . aspirin 81 MG tablet Take 81 mg by mouth daily.     Marland Kitchen lisinopril-hydrochlorothiazide (ZESTORETIC) 20-12.5 MG tablet TAKE 1 TABLET BY MOUTH EVERY DAY 90 tablet 3  . Multiple Vitamin (MULTIVITAMIN WITH MINERALS) TABS Take 1 tablet by mouth daily.    . simvastatin (ZOCOR) 20 MG tablet TAKE 1/2 TABLET BY MOUTH 3 TIMES A WEEK ON MONDAY, WEDNESDAY, AND SATURDAY. [place]  tablet 8   No current facility-administered medications on file prior to visit.   Past Medical History:  Diagnosis Date  . Allergic rhinitis   . Dislocated shoulder     right   . Flying phobia    General anxieety also-xanax prn  . Hematuria    asymptomatic  . Hx of colonic polyps   . Hypertension    Past Surgical History:  Procedure Laterality Date  . HERNIA REPAIR  1999   Dr. Lara Mulch  . LAPAROSCOPY Right 08/07/2012   Procedure: LAPAROSCOPY OPERATIVE;  Surgeon: Elveria Royals, MD;  Location: [facility];  Service: Gynecology;  Laterality: Right;  Removal Right Ovarian Cyst Wall, Pelvic Washings    Allergies  Allergen Reactions  . Oysters [Shellfish Allergy] Swelling    Swelling of lips next day after eating oysters  . Penicillins Hives  . Sulfamethoxazole-Trimethoprim Other (See Comments)    REACTION: turned firey red    Family History  Problem Relation Age of Onset  . Hypertension Mother   . Osteoporosis Mother   . Lymphoma Father   . Breast cancer Maternal Grandmother     Social History   Socioeconomic History  . Marital status: Married    Spouse name: Not on file  . Number of children: Not on file  . Years of education: Not on file  . Highest education level: Not on file  Occupational History  . Not on file  Tobacco Use  . Smoking status: Former Research scientist (life sciences)  . Smokeless tobacco: Never Used  Substance and Sexual Activity  . Alcohol use: Yes    Comment: Occasionnally-social  . Drug use: No  . Sexual activity: Not on file  Other Topics Concern  . Not on file  Social History Narrative  . Not on file   Social Determinants of Health   Financial Resource Strain: Not on file  Food Insecurity: Not on file  Transportation Needs: Not on file  Physical Activity: Not on file  Stress: Not on file  Social Connections: Not on file   Vitals:   05/24/20 0854  BP: 126/80  Pulse: 79  Resp: 16  Temp: 98.3 F (36.8 C)  SpO2: 97%   Body mass index is 21.87 kg/m.  Wt Readings from Last 3 Encounters:  05/24/20 127 lb 6.4 oz (57.8 kg)  09/08/19 129 lb 8 oz (58.7 kg)  04/15/19 129 lb (58.5 kg)   Physical Exam Vitals  and nursing note reviewed.  Constitutional:      General: She is not in acute distress.    Appearance: She is well-developed and normal weight.  HENT:     Head: Normocephalic and atraumatic.     Right Ear: Hearing, tympanic membrane, ear canal and external ear normal.     Left Ear: Hearing and external ear normal.     Ears:     Comments: Left cerumen excess, TM seen partially.    Nose: Rhinorrhea present.     Mouth/Throat:     Mouth: Mucous membranes are moist.     Pharynx: Oropharynx is clear. Uvula midline.  Eyes:     Extraocular Movements: Extraocular movements intact.     Conjunctiva/sclera: Conjunctivae normal.     Pupils: Pupils are equal, round, and reactive to light.  Neck:     Thyroid: No thyroid mass.  Cardiovascular:     Rate and Rhythm: Normal rate and regular rhythm.     Pulses:          Dorsalis pedis pulses  are 2+ on the right side and 2+ on the left side.     Heart sounds: No murmur heard.   Pulmonary:     Effort: Pulmonary effort is normal. No respiratory distress.     Breath sounds: Normal breath sounds.  Abdominal:     Palpations: Abdomen is soft. There is no hepatomegaly or mass.     Tenderness: There is no abdominal tenderness.  Musculoskeletal:     Comments: No signs of synovitis appreciated.  Lymphadenopathy:     Cervical: No cervical adenopathy.  Skin:    General: Skin is warm.     Findings: No erythema or rash.  Neurological:     General: No focal deficit present.     Mental Status: She is alert and oriented to person, place, and time.     Cranial Nerves: No cranial nerve deficit.     Coordination: Coordination normal.     Gait: Gait normal.     Deep Tendon Reflexes:     Reflex Scores:      Bicep reflexes are 2+ on the right side and 2+ on the left side.      Patellar reflexes are 2+ on the right side and 2+ on the left side. Psychiatric:        Speech: Speech normal.     Comments: Well groomed, good eye contact.   ASSESSMENT AND  PLAN:  Ms. ELNORE COSENS was here today annual physical examination.  Orders Placed This Encounter  Procedures  . Comprehensive metabolic panel  . Lipid panel  . Hemoglobin A1c   Lab Results  Component Value Date   HGBA1C 5.6 05/24/2020   Lab Results  Component Value Date   CHOL 214 (H) 05/24/2020   HDL 70.90 05/24/2020   LDLCALC 127 (H) 05/24/2020   LDLDIRECT 172.5 01/01/2012   TRIG 82.0 05/24/2020   CHOLHDL 3 05/24/2020   Lab Results  Component Value Date   ALT 12 05/24/2020   AST 17 05/24/2020   ALKPHOS 57 05/24/2020   BILITOT 0.6 05/24/2020   Lab Results  Component Value Date   CREATININE 0.88 05/24/2020   BUN 9 05/24/2020   NA 133 (L) 05/24/2020   K 4.3 05/24/2020   CL 98 05/24/2020   CO2 27 05/24/2020   Routine general medical examination at a health care facility We discussed the importance of regular low impact physical activity and healthy diet for prevention of chronic illness and/or complications. She is due for colon cancer screening, we discussed options, she would like colonoscopy but not at this time.She will let me know when she feels ready to do it. Preventive guidelines reviewed. Vaccination up to date.  Ca++ and vit D supplementation recommended. Next CPE in a year.  Hyperlipidemia, unspecified hyperlipidemia type Continue Simvastatin 20 mg daily and low fat diet. Further recommendations according to FLP results.  Essential hypertension, benign BP adequately controlled. Monitor BP at home. Continue low salt diet. No changes in Lisinopril-HCTZ dose. She repfers to continue following annually.  Anxiety disorder, unspecified type Stable. Continue Lorazepam 0.5 mg 1/2-1 tab daily prn. As far as prescription sent today lasts a year, she can follow in a year.  Hyperglycemia A healthy life style to continue for diabetes prevention. Further recommendations according to HgA1C result.  Insomnia, unspecified Good sleep hygiene. Continue  Lorazepam 0.5 mg 1/2-1 tab daily prn.  -     LORazepam (ATIVAN) 0.5 MG tablet; Take 0.5-1 tablets (0.25-0.5 mg total) by mouth daily as needed  for anxiety. A year supply.  Excessive cerumen in ear canal, left Without impaction, so ear lavage was not recommended. Avoid Q tips. OTC Debrox a few times per week may help. F/U as needed.  -     carbamide peroxide (DEBROX) 6.5 % OTIC solution; Place 5 drops into the left ear 2 (two) times daily.  Seasonal allergic rhinitis due to pollen Nasal saline irrigations as needed. Flonase nasal spray daily prn.  -     fluticasone (FLONASE) 50 MCG/ACT nasal spray; Place 1 spray into both nostrils 2 (two) times daily.   Return in 1 year (on 05/24/2021) for cpe and f/u. Needs AWV.   Taysen Bushart G. Martinique, MD  [facility]. [place] office.   A few things to remember from today's visit:   Routine general medical examination at a health care facility  Hyperlipidemia, unspecified hyperlipidemia type - Plan: Comprehensive metabolic panel, Lipid panel  Essential hypertension, benign - Plan: Comprehensive metabolic panel  Anxiety disorder, unspecified type  Hyperglycemia - Plan: Hemoglobin A1c  Insomnia, unspecified - Plan: LORazepam (ATIVAN) 0.5 MG tablet  Excessive cerumen in ear canal, left - Plan: carbamide peroxide (DEBROX) 6.5 % OTIC solution  Seasonal allergic rhinitis due to pollen - Plan: fluticasone (FLONASE) 50 MCG/ACT nasal spray  If you need refills please call your pharmacy. Do not use My Chart to request refills or for acute issues that need immediate attention.   Please let me know if you decide to have colonoscopy or other colon cancer screening test. Colonoscopy is the ideal, if normal it can be repeated every 10 years.  Immunochemical test (FIT): Hemoccult cards, very sensitive if all 3 cards are done. If negative it can be repeated annually. If positive a colonoscopy should follow.   Cologuard: Intended for  patients with average risk factors for colon cancer from 21-51 years old. Anyone with prior history of colon polyps or family history of colonic neoplasia is NOT average risk. May be repeated every 3 years.  It is not as sensitive in older patients. Cost is around  $ 649. If positive a diagnostic colonoscopy must be done.   A few tips:  -As we age balance is not as good as it was, so there is a higher risks for falls. Please remove small rugs and furniture that is "in your way" and could increase the risk of falls. Stretching exercises may help with fall prevention: Yoga and Tai Chi are some examples. Low impact exercise is better, so you are not very achy the next day.  -Sun screen and avoidance of direct sun light recommended. Caution with dehydration, if working outdoors be sure to drink enough fluids.  - Some medications are not safe as we age, increases the risk of side effects and can potentially interact with other medication you are also taken;  including some of over the counter medications. Be sure to let me know when you start a new medication even if it is a dietary/vitamin supplement.   -Healthy diet low in red meet/animal fat and sugar + regular physical activity is recommended.      Please be sure medication list is accurate. If a new problem present, please set up appointment sooner than planned today.

## 2020-06-08 DIAGNOSIS — H02825 Cysts of left lower eyelid: Secondary | ICD-10-CM | POA: Diagnosis not present

## 2020-06-08 DIAGNOSIS — H25813 Combined forms of age-related cataract, bilateral: Secondary | ICD-10-CM | POA: Diagnosis not present

## 2020-06-08 DIAGNOSIS — D3132 Benign neoplasm of left choroid: Secondary | ICD-10-CM | POA: Diagnosis not present

## 2020-06-08 DIAGNOSIS — H02824 Cysts of left upper eyelid: Secondary | ICD-10-CM | POA: Diagnosis not present

## 2020-06-14 ENCOUNTER — Ambulatory Visit: Payer: Medicare PPO

## 2020-06-28 ENCOUNTER — Ambulatory Visit
Admission: RE | Admit: 2020-06-28 | Discharge: 2020-06-28 | Disposition: A | Discharge status: Home / Self Care | Payer: Medicare PPO | Source: Ambulatory Visit | Attending: Family Medicine | Admitting: Family Medicine

## 2020-06-28 ENCOUNTER — Other Ambulatory Visit: Payer: Self-pay

## 2020-06-28 DIAGNOSIS — Z1231 Encounter for screening mammogram for malignant neoplasm of breast: Secondary | ICD-10-CM

## 2020-07-08 ENCOUNTER — Telehealth: Payer: Self-pay | Admitting: Family Medicine

## 2020-07-08 NOTE — Telephone Encounter (Signed)
Spoke with patient to schedule Medicare Annual Wellness Visit (AWV) either virtually or in office.  She stated she will call back  Last AWV 04/15/19  please schedule at anytime with [facility] Nurse Health Advisor 1 or 2   This should be a 45 minute visit.

## 2020-07-20 ENCOUNTER — Ambulatory Visit: Payer: Medicare PPO

## 2020-07-26 ENCOUNTER — Ambulatory Visit (INDEPENDENT_AMBULATORY_CARE_PROVIDER_SITE_OTHER): Payer: Medicare PPO

## 2020-07-26 DIAGNOSIS — Z1211 Encounter for screening for malignant neoplasm of colon: Secondary | ICD-10-CM

## 2020-07-26 DIAGNOSIS — Z0000 Encounter for general adult medical examination without abnormal findings: Secondary | ICD-10-CM | POA: Diagnosis not present

## 2020-07-26 NOTE — Patient Instructions (Signed)
Nicole [name] , Thank you for taking time to come for your Medicare Wellness Visit. I appreciate your ongoing commitment to your health goals. Please review the following plan we discussed and let me know if I can assist you in the future.   Screening recommendations/referrals: Colonoscopy: referral completed 07/26/2020 Mammogram: current 06/28/2020 Bone Density: current 06/30/2019 Recommended yearly ophthalmology/optometry visit for glaucoma screening and checkup Recommended yearly dental visit for hygiene and checkup  Vaccinations: Influenza vaccine: due in fall 2022 Pneumococcal vaccine: completed series  Tdap vaccine: due upon injury  Shingles vaccine: will obtain local pharmacy   Advanced directives: will provide copies   Conditions/risks identified: none   Next appointment: none    Preventive Care 31 Years and Older, Female Preventive care refers to lifestyle choices and visits with your health care provider that can promote health and wellness. What does preventive care include? A yearly physical exam. This is also called an annual well check. Dental exams once or twice a year. Routine eye exams. Ask your health care provider how often you should have your eyes checked. Personal lifestyle choices, including: Daily care of your teeth and gums. Regular physical activity. Eating a healthy diet. Avoiding tobacco and drug use. Limiting alcohol use. Practicing safe sex. Taking low-dose aspirin every day. Taking vitamin and mineral supplements as recommended by your health care provider. What happens during an annual well check? The services and screenings done by your health care provider during your annual well check will depend on your age, overall health, lifestyle risk factors, and family history of disease. Counseling  Your health care provider may ask you questions about your: Alcohol use. Tobacco use. Drug use. Emotional well-being. Home and relationship  well-being. Sexual activity. Eating habits. History of falls. Memory and ability to understand (cognition). Work and work Statistician. Reproductive health. Screening  You may have the following tests or measurements: Height, weight, and BMI. Blood pressure. Lipid and cholesterol levels. These may be checked every 5 years, or more frequently if you are over 36 years old. Skin check. Lung cancer screening. You may have this screening every year starting at age 48 if you have a 30-pack-year history of smoking and currently smoke or have quit within the past 15 years. Fecal occult blood test (FOBT) of the stool. You may have this test every year starting at age 76. Flexible sigmoidoscopy or colonoscopy. You may have a sigmoidoscopy every 5 years or a colonoscopy every 10 years starting at age 65. Hepatitis C blood test. Hepatitis B blood test. Sexually transmitted disease (STD) testing. Diabetes screening. This is done by checking your blood sugar (glucose) after you have not eaten for a while (fasting). You may have this done every 1-3 years. Bone density scan. This is done to screen for osteoporosis. You may have this done starting at age 51. Mammogram. This may be done every 1-2 years. Talk to your health care provider about how often you should have regular mammograms. Talk with your health care provider about your test results, treatment options, and if necessary, the need for more tests. Vaccines  Your health care provider may recommend certain vaccines, such as: Influenza vaccine. This is recommended every year. Tetanus, diphtheria, and acellular pertussis (Tdap, Td) vaccine. You may need a Td booster every 10 years. Zoster vaccine. You may need this after age 55. Pneumococcal 13-valent conjugate (PCV13) vaccine. One dose is recommended after age 48. Pneumococcal polysaccharide (PPSV23) vaccine. One dose is recommended after age 14. Talk to your health  care provider about which  screenings and vaccines you need and how often you need them. This information is not intended to replace advice given to you by your health care provider. Make sure you discuss any questions you have with your health care provider. Document Released: 02/04/2015 Document Revised: 09/28/2015 Document Reviewed: 11/09/2014 Elsevier Interactive Patient Education  2017 [place] Prevention in the Home Falls can cause injuries. They can happen to people of all ages. There are many things you can do to make your home safe and to help prevent falls. What can I do on the outside of my home? Regularly fix the edges of walkways and driveways and fix any cracks. Remove anything that might make you trip as you walk through a door, such as a raised step or threshold. Trim any bushes or trees on the path to your home. Use bright outdoor lighting. Clear any walking paths of anything that might make someone trip, such as rocks or tools. Regularly check to see if handrails are loose or broken. Make sure that both sides of any steps have handrails. Any raised decks and porches should have guardrails on the edges. Have any leaves, snow, or ice cleared regularly. Use sand or salt on walking paths during winter. Clean up any spills in your garage right away. This includes oil or grease spills. What can I do in the bathroom? Use night lights. Install grab bars by the toilet and in the tub and shower. Do not use towel bars as grab bars. Use non-skid mats or decals in the tub or shower. If you need to sit down in the shower, use a plastic, non-slip stool. Keep the floor dry. Clean up any water that spills on the floor as soon as it happens. Remove soap buildup in the tub or shower regularly. Attach bath mats securely with double-sided non-slip rug tape. Do not have throw rugs and other things on the floor that can make you trip. What can I do in the bedroom? Use night lights. Make sure that you have a  light by your bed that is easy to reach. Do not use any sheets or blankets that are too big for your bed. They should not hang down onto the floor. Have a firm chair that has side arms. You can use this for support while you get dressed. Do not have throw rugs and other things on the floor that can make you trip. What can I do in the kitchen? Clean up any spills right away. Avoid walking on wet floors. Keep items that you use a lot in easy-to-reach places. If you need to reach something above you, use a strong step stool that has a grab bar. Keep electrical cords out of the way. Do not use floor polish or wax that makes floors slippery. If you must use wax, use non-skid floor wax. Do not have throw rugs and other things on the floor that can make you trip. What can I do with my stairs? Do not leave any items on the stairs. Make sure that there are handrails on both sides of the stairs and use them. Fix handrails that are broken or loose. Make sure that handrails are as long as the stairways. Check any carpeting to make sure that it is firmly attached to the stairs. Fix any carpet that is loose or worn. Avoid having throw rugs at the top or bottom of the stairs. If you do have throw rugs, attach them to  the floor with carpet tape. Make sure that you have a light switch at the top of the stairs and the bottom of the stairs. If you do not have them, ask someone to add them for you. What else can I do to help prevent falls? Wear shoes that: Do not have high heels. Have rubber bottoms. Are comfortable and fit you well. Are closed at the toe. Do not wear sandals. If you use a stepladder: Make sure that it is fully opened. Do not climb a closed stepladder. Make sure that both sides of the stepladder are locked into place. Ask someone to hold it for you, if possible. Clearly mark and make sure that you can see: Any grab bars or handrails. First and last steps. Where the edge of each step  is. Use tools that help you move around (mobility aids) if they are needed. These include: Canes. Walkers. Scooters. Crutches. Turn on the lights when you go into a dark area. Replace any light bulbs as soon as they burn out. Set up your furniture so you have a clear path. Avoid moving your furniture around. If any of your floors are uneven, fix them. If there are any pets around you, be aware of where they are. Review your medicines with your doctor. Some medicines can make you feel dizzy. This can increase your chance of falling. Ask your doctor what other things that you can do to help prevent falls. This information is not intended to replace advice given to you by your health care provider. Make sure you discuss any questions you have with your health care provider. Document Released: 11/04/2008 Document Revised: 06/16/2015 Document Reviewed: 02/12/2014 Elsevier Interactive Patient Education  2017 [employer].

## 2020-07-26 NOTE — Progress Notes (Signed)
Subjective:   Nicole [name] is a 75 y.o. female who presents for Medicare Annual (Subsequent) preventive examination.  I connected with Nicole [name] today by telephone and verified that I am speaking with the correct person using two identifiers. Location patient: home Location provider: work Persons participating in the virtual visit: patient, provider.   I discussed the limitations, risks, security and privacy concerns of performing an evaluation and management service by telephone and the availability of in person appointments. I also discussed with the patient that there may be a patient responsible charge related to this service. The patient expressed understanding and verbally consented to this telephonic visit.    Interactive audio and video telecommunications were attempted between this provider and patient, however failed, due to patient having technical difficulties OR patient did not have access to video capability.  We continued and completed visit with audio only.     Review of Systems    N/a       Objective:    There were no vitals filed for this visit. There is no height or weight on file to calculate BMI.  Advanced Directives 08/07/2012 07/31/2012  Does Patient Have a Medical Advance Directive? - Patient has advance directive, copy not in chart  Type of Advance Directive Healthcare Power of [place]  Pre-existing out of facility DNR order (yellow form or pink MOST form) - No    Current Medications (verified) Outpatient Encounter Medications as of 07/26/2020  Medication Sig   aspirin 81 MG tablet Take 81 mg by mouth daily.    carbamide peroxide (DEBROX) 6.5 % OTIC solution Place 5 drops into the left ear 2 (two) times daily.   fluticasone (FLONASE) 50 MCG/ACT nasal spray Place 1 spray into both nostrils 2 (two) times daily.   lisinopril-hydrochlorothiazide (ZESTORETIC) 20-12.5 MG tablet TAKE 1 TABLET BY MOUTH EVERY DAY   LORazepam  (ATIVAN) 0.5 MG tablet Take 0.5-1 tablets (0.25-0.5 mg total) by mouth daily as needed for anxiety. A year supply.   Multiple Vitamin (MULTIVITAMIN WITH MINERALS) TABS Take 1 tablet by mouth daily.   simvastatin (ZOCOR) 20 MG tablet TAKE 1/2 TABLET BY MOUTH 3 TIMES A WEEK ON MONDAY, WEDNESDAY, AND SATURDAY.   No facility-administered encounter medications on file as of 07/26/2020.    Allergies (verified) Oysters [shellfish allergy], Penicillins, and Sulfamethoxazole-trimethoprim   History: Past Medical History:  Diagnosis Date   Allergic rhinitis    Dislocated shoulder    right    Flying phobia    General anxieety also-xanax prn   Hematuria    asymptomatic   Hx of colonic polyps    Hypertension    Past Surgical History:  Procedure Laterality Date   HERNIA REPAIR  1999   Dr. Lara Mulch   LAPAROSCOPY Right 08/07/2012   Procedure: LAPAROSCOPY OPERATIVE;  Surgeon: Elveria Royals, MD;  Location: [facility];  Service: Gynecology;  Laterality: Right;  Removal Right Ovarian Cyst Wall, Pelvic Washings   Family History  Problem Relation Age of Onset   Hypertension Mother    Osteoporosis Mother    Lymphoma Father    Breast cancer Maternal Aunt 17   Social History   Socioeconomic History   Marital status: Married    Spouse name: Not on file   Number of children: Not on file   Years of education: Not on file   Highest education level: Not on file  Occupational History   Not on file  Tobacco Use  Smoking status: Former    Pack years: 0.00   Smokeless tobacco: Never  Substance and Sexual Activity   Alcohol use: Yes    Comment: Occasionnally-social   Drug use: No   Sexual activity: Not on file  Other Topics Concern   Not on file  Social History Narrative   Not on file   Social Determinants of Health   Financial Resource Strain: Not on file  Food Insecurity: Not on file  Transportation Needs: Not on file  Physical Activity: Not on file  Stress: Not on file   Social Connections: Not on file    Tobacco Counseling Counseling given: Not Answered   Clinical Intake:                 Diabetic?no         Activities of Daily Living No flowsheet data found.  Patient Care Team: Martinique, Betty G, MD as PCP - General (Family Medicine)  Indicate any recent Medical Services you may have received from other than [facility] providers in the past year (date may be approximate).     Assessment:   This is a routine wellness examination for [name].  Hearing/Vision screen No results found.  Dietary issues and exercise activities discussed:     Goals Addressed   None    Depression Screen PHQ 2/9 Scores 05/24/2020 04/15/2019 01/25/2018 01/20/2018 09/22/2015 05/11/2014  PHQ - 2 Score 0 0 0 0 0 0    Fall Risk Fall Risk  05/24/2020 04/15/2019 01/20/2018 09/22/2015 05/11/2014  Falls in the past year? 0 0 0 No No  Number falls in past yr: 0 - 0 - -  Injury with Fall? 0 - 0 - -  Follow up Education provided - Education provided - -    FALL RISK PREVENTION PERTAINING TO THE HOME:  Any stairs in or around the home? Yes  If so, are there any without handrails? Yes  Home free of loose throw rugs in walkways, pet beds, electrical cords, etc? Yes  Adequate lighting in your home to reduce risk of falls? Yes   ASSISTIVE DEVICES UTILIZED TO PREVENT FALLS:  Life alert? No  Use of a cane, walker or w/c? No  Grab bars in the bathroom? Yes  Shower chair or bench in shower? Yes  Elevated toilet seat or a handicapped toilet? No    Cognitive Function:  Normal cognitive status assessed by direct observation by this Nurse Health Advisor. No abnormalities found.        Immunizations Immunization History  Administered Date(s) Administered   PFIZER Comirnaty(Gray Top)Covid-19 Tri-Sucrose Vaccine 04/26/2020   PFIZER(Purple Top)SARS-COV-2 Vaccination 02/16/2019, 03/09/2019   Pneumococcal Conjugate-13 08/28/2016   Pneumococcal Polysaccharide-23  05/21/2008, 01/20/2018   Td 10/23/2005, 09/07/2009    TDAP status: Due, Education has been provided regarding the importance of this vaccine. Advised may receive this vaccine at local pharmacy or Health Dept. Aware to provide a copy of the vaccination record if obtained from local pharmacy or Health Dept. Verbalized acceptance and understanding.  Flu Vaccine status: Up to date  Pneumococcal vaccine status: Up to date  Covid-19 vaccine status: Completed vaccines  Qualifies for Shingles Vaccine? Yes   Zostavax completed No   Shingrix Completed?: No.    Education has been provided regarding the importance of this vaccine. Patient has been advised to call insurance company to determine out of pocket expense if they have not yet received this vaccine. Advised may also receive vaccine at local pharmacy or Health Dept. Verbalized  acceptance and understanding.  Screening Tests Health Maintenance  Topic Date Due   Zoster Vaccines- Shingrix (1 of 2) Never done   COVID-19 Vaccine (4 - Booster for Pfizer series) 07/26/2020   TETANUS/TDAP  01/27/2021 (Originally 09/08/2019)   COLONOSCOPY (Pts 45-68yrs Insurance coverage will need to be confirmed)  05/31/2021 (Originally 05/22/2018)   DEXA SCAN  Completed   Hepatitis C Screening  Completed   PNA vac Low Risk Adult  Completed   HPV VACCINES  Aged Out   INFLUENZA VACCINE  Discontinued    Health Maintenance  Health Maintenance Due  Topic Date Due   Zoster Vaccines- Shingrix (1 of 2) Never done   COVID-19 Vaccine (4 - Booster for Pfizer series) 07/26/2020    Colorectal cancer screening: Referral to GI placed 07/26/2020. Pt aware the office will call re: appt.  Mammogram status: Completed 06/28/2020. Repeat every year  Bone Density status: Completed 06/30/2019. Results reflect: Bone density results: OSTEOPENIA. Repeat every 5 years.  Lung Cancer Screening: (Low Dose CT Chest recommended if Age 57-80 years, 30 pack-year currently smoking OR  have quit w/in 15years.) does not qualify.   Lung Cancer Screening Referral: n/a  Additional Screening:  Hepatitis C Screening: does not qualify; Completed 08/28/2016  Vision Screening: Recommended annual ophthalmology exams for early detection of glaucoma and other disorders of the eye. Is the patient up to date with their annual eye exam?  Yes  Who is the provider or what is the name of the office in which the patient attends annual eye exams? Dr.Glenn If pt is not established with a provider, would they like to be referred to a provider to establish care? No .   Dental Screening: Recommended annual dental exams for proper oral hygiene  Community Resource Referral / Chronic Care Management: CRR required this visit?  No   CCM required this visit?  No      Plan:     I have personally reviewed and noted the following in the patient's chart:   Medical and social history Use of alcohol, tobacco or illicit drugs  Current medications and supplements including opioid prescriptions.  Functional ability and status Nutritional status Physical activity Advanced directives List of other physicians Hospitalizations, surgeries, and ER visits in previous 12 months Vitals Screenings to include cognitive, depression, and falls Referrals and appointments  In addition, I have reviewed and discussed with patient certain preventive protocols, quality metrics, and best practice recommendations. A written personalized care plan for preventive services as well as general preventive health recommendations were provided to patient.     Randel Pigg, LPN   03/29/1769   Nurse Notes: none

## 2020-08-31 ENCOUNTER — Other Ambulatory Visit: Payer: Self-pay | Admitting: Family Medicine

## 2020-08-31 DIAGNOSIS — E785 Hyperlipidemia, unspecified: Secondary | ICD-10-CM

## 2020-10-24 DIAGNOSIS — R519 Headache, unspecified: Secondary | ICD-10-CM | POA: Diagnosis not present

## 2021-02-07 ENCOUNTER — Other Ambulatory Visit: Payer: Self-pay | Admitting: Family Medicine

## 2021-02-07 DIAGNOSIS — F5105 Insomnia due to other mental disorder: Secondary | ICD-10-CM

## 2021-02-15 ENCOUNTER — Other Ambulatory Visit: Payer: Self-pay | Admitting: Family Medicine

## 2021-02-15 DIAGNOSIS — Z1231 Encounter for screening mammogram for malignant neoplasm of breast: Secondary | ICD-10-CM

## 2021-05-09 ENCOUNTER — Other Ambulatory Visit: Payer: Self-pay | Admitting: Family Medicine

## 2021-05-09 DIAGNOSIS — F99 Mental disorder, not otherwise specified: Secondary | ICD-10-CM

## 2021-05-09 NOTE — Telephone Encounter (Signed)
Last filled 11/28/21 

## 2021-05-15 DIAGNOSIS — H02824 Cysts of left upper eyelid: Secondary | ICD-10-CM | POA: Diagnosis not present

## 2021-05-15 DIAGNOSIS — D3132 Benign neoplasm of left choroid: Secondary | ICD-10-CM | POA: Diagnosis not present

## 2021-05-15 DIAGNOSIS — H25813 Combined forms of age-related cataract, bilateral: Secondary | ICD-10-CM | POA: Diagnosis not present

## 2021-05-23 DIAGNOSIS — L821 Other seborrheic keratosis: Secondary | ICD-10-CM | POA: Diagnosis not present

## 2021-05-23 DIAGNOSIS — Z85828 Personal history of other malignant neoplasm of skin: Secondary | ICD-10-CM | POA: Diagnosis not present

## 2021-05-23 DIAGNOSIS — L814 Other melanin hyperpigmentation: Secondary | ICD-10-CM | POA: Diagnosis not present

## 2021-05-23 DIAGNOSIS — D2262 Melanocytic nevi of left upper limb, including shoulder: Secondary | ICD-10-CM | POA: Diagnosis not present

## 2021-05-23 DIAGNOSIS — Z85820 Personal history of malignant melanoma of skin: Secondary | ICD-10-CM | POA: Diagnosis not present

## 2021-05-23 DIAGNOSIS — E755 Other lipid storage disorders: Secondary | ICD-10-CM | POA: Diagnosis not present

## 2021-05-23 DIAGNOSIS — D2271 Melanocytic nevi of right lower limb, including hip: Secondary | ICD-10-CM | POA: Diagnosis not present

## 2021-05-23 DIAGNOSIS — D225 Melanocytic nevi of trunk: Secondary | ICD-10-CM | POA: Diagnosis not present

## 2021-05-23 DIAGNOSIS — D2261 Melanocytic nevi of right upper limb, including shoulder: Secondary | ICD-10-CM | POA: Diagnosis not present

## 2021-05-26 NOTE — Progress Notes (Addendum)
? ?HPI: ?[name] is a 76 y.o. female, who is here today for her routine physical. ? ?Last CPE: 05/24/20 ? ?Regular exercise: Walking 7 times per week, 25-60 min. ?Following a healthful diet: Cooking at home most of time. ? ?Chronic medical problems: Hypertension, anxiety, insomnia, hyperlipidemia, and skin cancer among some. ? ?Immunization History  ?Administered Date(s) Administered  ? Influenza-Unspecified 12/05/2020  ? PFIZER Comirnaty(Gray Top)Covid-19 Tri-Sucrose Vaccine 04/26/2020  ? PFIZER(Purple Top)SARS-COV-2 Vaccination 02/16/2019, 03/09/2019  ? Pneumococcal Conjugate-13 08/28/2016  ? Pneumococcal Polysaccharide-23 05/21/2008, 01/20/2018  ? Td 10/23/2005, 09/07/2009  ? ?Health Maintenance  ?Topic Date Due  ? COLONOSCOPY (Pts 45-23yr Insurance coverage will need to be confirmed)  05/22/2018  ? COVID-19 Vaccine (4 - Booster for PGenevaseries) 06/14/2021 (Originally 06/21/2020)  ? TETANUS/TDAP  01/26/2022 (Originally 09/08/2019)  ? Zoster Vaccines- Shingrix (1 of 2) 01/26/2022 (Originally 06/06/1964)  ? Pneumonia Vaccine 76 Years old  Completed  ? DEXA SCAN  Completed  ? Hepatitis C Screening  Completed  ? HPV VACCINES  Aged Out  ? INFLUENZA VACCINE  Discontinued  ? ?She has no new concerns today. 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 ?Planning on cataract surgery in 06/2021 nd 07/2021. ?Takes vit D supplementation and Aspirin 81 mg 3 times per week. ? ?HTN on Lisinopril-HCTZ 20-12,5 mg daily. ?Negative for Headache,CP,or SOB. ?Home BP's 110's-130's/50-60's, HR 60-70's. ?Lab Results  ?Component Value Date  ? CREATININE 0.88 05/24/2020  ? BUN 9 05/24/2020  ? NA 133 (L) 05/24/2020  ? K 4.3 05/24/2020  ? CL 98 05/24/2020  ? CO2 27 05/24/2020  ? ?HLD on Simvastatin 20 mg 1/2 tab 3 times per week. ?Lab Results  ?Component Value Date  ? CHOL 214 (H) 05/24/2020  ? HDL 70.90 05/24/2020  ? LDLCALC 127 (H) 05/24/2020  ? LDLDIRECT 172.5 01/01/2012  ? TRIG 82.0 05/24/2020  ? CHOLHDL 3 05/24/2020   ? ?Anxiety: She is on Lorazepam 0.5 mg 1/2-1 tab daily, she doe snot take medication frequently and a couple of refills lasts all year. ?Negative for depressed moos. ?She has taken med for years and tolerating well. ? ?Review of Systems  ?Constitutional:  Negative for appetite change, fatigue and fever.  ?HENT:  Negative for hearing loss, mouth sores and sore throat.   ?Eyes:  Negative for redness and visual disturbance.  ?Respiratory:  Negative for cough and wheezing.   ?Cardiovascular:  Negative for palpitations and leg swelling.  ?Gastrointestinal:  Negative for abdominal pain, nausea and vomiting.  ?     No changes in bowel habits.  ?Endocrine: Negative for cold intolerance, heat intolerance, polydipsia, polyphagia and polyuria.  ?Genitourinary:  Negative for decreased urine volume, dysuria, hematuria, vaginal bleeding and vaginal discharge.  ?Musculoskeletal:  Negative for gait problem and myalgias.  ?Skin:  Negative for color change and rash.  ?Allergic/Immunologic: Positive for environmental allergies.  ?Neurological:  Negative for syncope and weakness.  ?Hematological:  Negative for adenopathy. Does not bruise/bleed easily.  ?Psychiatric/Behavioral:  Negative for confusion and hallucinations.   ?All other systems reviewed and are negative. ? ?Current Outpatient Medications on File Prior to Visit  ?Medication Sig Dispense Refill  ? aspirin 81 MG tablet Take 81 mg by mouth daily.     ? lisinopril-hydrochlorothiazide (ZESTORETIC) 20-12.5 MG tablet TAKE 1 TABLET BY MOUTH EVERY DAY 90 tablet 3  ? simvastatin (ZOCOR) 20 MG tablet TAKE 1/2 TABLET BY MOUTH 3 TIMES A WEEK ON MONDAY, WEDNESDAY, AND SATURDAY. 18 tablet 8  ? ?No current facility-administered  medications on file prior to visit.  ? ?Past Medical History:  ?Diagnosis Date  ? Allergic rhinitis   ? Dislocated shoulder   ? right   ? Flying phobia   ? General anxieety also-xanax prn  ? Hematuria   ? asymptomatic  ? Hx of colonic polyps   ? Hypertension    ? ?Past Surgical History:  ?Procedure Laterality Date  ? HERNIA REPAIR  1999  ? Dr. Lara Mulch  ? LAPAROSCOPY Right 08/07/2012  ? Procedure: LAPAROSCOPY OPERATIVE;  Surgeon: Elveria Royals, MD;  Location: [facility];  Service: Gynecology;  Laterality: Right;  Removal Right Ovarian Cyst Wall, Pelvic Washings  ? ?Allergies  ?Allergen Reactions  ? Oysters [Shellfish Allergy] Swelling  ?  Swelling of lips next day after eating oysters  ? Penicillins Hives  ? Sulfamethoxazole-Trimethoprim Other (See Comments)  ?  REACTION: turned firey red  ? ?Family History  ?Problem Relation Age of Onset  ? Hypertension Mother   ? Osteoporosis Mother   ? Lymphoma Father   ? Breast cancer Maternal Aunt 30  ? ?Social History  ? ?Socioeconomic History  ? Marital status: Married  ?  Spouse name: Not on file  ? Number of children: Not on file  ? Years of education: Not on file  ? Highest education level: Not on file  ?Occupational History  ? Not on file  ?Tobacco Use  ? Smoking status: Former  ? Smokeless tobacco: Never  ?Substance and Sexual Activity  ? Alcohol use: Yes  ?  Comment: Occasionnally-social  ? Drug use: No  ? Sexual activity: Not on file  ?Other Topics Concern  ? Not on file  ?Social History Narrative  ? Not on file  ? ?Social Determinants of Health  ? ?Financial Resource Strain: Low Risk   ? Difficulty of Paying Living Expenses: Not hard at all  ?Food Insecurity: No Food Insecurity  ? Worried About Charity fundraiser in the Last Year: Never true  ? Ran Out of Food in the Last Year: Never true  ?Transportation Needs: No Transportation Needs  ? Lack of Transportation (Medical): No  ? Lack of Transportation (Non-Medical): No  ?Physical Activity: Sufficiently Active  ? Days of Exercise per Week: 7 days  ? Minutes of Exercise per Session: 30 min  ?Stress: No Stress Concern Present  ? Feeling of Stress : Not at all  ?Social Connections: Moderately Integrated  ? Frequency of Communication with Friends and Family: Three  times a week  ? Frequency of Social Gatherings with Friends and Family: Three times a week  ? Attends Religious Services: Never  ? Active Member of Clubs or Organizations: Yes  ? Attends Archivist Meetings: More than 4 times per year  ? Marital Status: Married  ? ?Vitals:  ? 05/29/21 0759  ?BP: 120/70  ?Pulse: 100  ?Resp: 16  ?SpO2: 99%  ? ?Body mass index is 21.63 kg/m?. ? ?Wt Readings from Last 3 Encounters:  ?05/29/21 126 lb (57.2 kg)  ?05/24/20 127 lb 6.4 oz (57.8 kg)  ?09/08/19 129 lb 8 oz (58.7 kg)  ? ?Physical Exam ?Vitals and nursing note reviewed.  ?Constitutional:   ?   General: She is not in acute distress. ?   Appearance: She is well-developed.  ?HENT:  ?   Head: Normocephalic and atraumatic.  ?   Right Ear: Hearing, tympanic membrane, ear canal and external ear normal.  ?   Left Ear: Hearing, tympanic membrane, ear canal and external  ear normal.  ?   Mouth/Throat:  ?   Mouth: Mucous membranes are moist.  ?   Pharynx: Oropharynx is clear. Uvula midline.  ?Eyes:  ?   Extraocular Movements: Extraocular movements intact.  ?   Conjunctiva/sclera: Conjunctivae normal.  ?   Pupils: Pupils are equal, round, and reactive to light.  ?Neck:  ?   Thyroid: No thyroid mass.  ?   Trachea: No tracheal deviation.  ?Cardiovascular:  ?   Rate and Rhythm: Normal rate and regular rhythm.  ?   Pulses:     ?     Dorsalis pedis pulses are 2+ on the right side and 2+ on the left side.  ?   Heart sounds: No murmur heard. ?Pulmonary:  ?   Effort: Pulmonary effort is normal. No respiratory distress.  ?   Breath sounds: Normal breath sounds.  ?Abdominal:  ?   Palpations: Abdomen is soft. There is no hepatomegaly or mass.  ?   Tenderness: There is no abdominal tenderness.  ?Genitourinary: ?   Comments: Deferred to gyn. ?Musculoskeletal:  ?   Comments: No major deformity or signs of synovitis appreciated.  ?Lymphadenopathy:  ?   Cervical: No cervical adenopathy.  ?Skin: ?   General: Skin is warm.  ?   Findings: No rash.  ?    Comments: Facial erythematous areas on cheeks when AK were recently treated.  ?Neurological:  ?   General: No focal deficit present.  ?   Mental Status: She is alert and oriented to person, place, and time.  ?

## 2021-05-29 ENCOUNTER — Ambulatory Visit (INDEPENDENT_AMBULATORY_CARE_PROVIDER_SITE_OTHER): Payer: Medicare PPO | Admitting: Family Medicine

## 2021-05-29 ENCOUNTER — Encounter: Payer: Self-pay | Admitting: Family Medicine

## 2021-05-29 VITALS — BP 120/70 | HR 100 | Resp 16 | Ht 64.0 in | Wt 126.0 lb

## 2021-05-29 DIAGNOSIS — F99 Mental disorder, not otherwise specified: Secondary | ICD-10-CM | POA: Diagnosis not present

## 2021-05-29 DIAGNOSIS — F5105 Insomnia due to other mental disorder: Secondary | ICD-10-CM

## 2021-05-29 DIAGNOSIS — E785 Hyperlipidemia, unspecified: Secondary | ICD-10-CM | POA: Diagnosis not present

## 2021-05-29 DIAGNOSIS — I10 Essential (primary) hypertension: Secondary | ICD-10-CM

## 2021-05-29 DIAGNOSIS — Z0000 Encounter for general adult medical examination without abnormal findings: Secondary | ICD-10-CM | POA: Diagnosis not present

## 2021-05-29 DIAGNOSIS — F419 Anxiety disorder, unspecified: Secondary | ICD-10-CM

## 2021-05-29 LAB — COMPREHENSIVE METABOLIC PANEL
ALT: 14 U/L (ref 0–35)
AST: 22 U/L (ref 0–37)
Albumin: 4.6 g/dL (ref 3.5–5.2)
Alkaline Phosphatase: 51 U/L (ref 39–117)
BUN: 17 mg/dL (ref 6–23)
CO2: 27 mEq/L (ref 19–32)
Calcium: 9.6 mg/dL (ref 8.4–10.5)
Chloride: 98 mEq/L (ref 96–112)
Creatinine, Ser: 0.93 mg/dL (ref 0.40–1.20)
GFR: 59.93 mL/min — ABNORMAL LOW (ref >60.00)
Glucose, Bld: 104 mg/dL — ABNORMAL HIGH (ref 70–99)
Potassium: 4.2 mEq/L (ref 3.5–5.1)
Sodium: 134 mEq/L — ABNORMAL LOW (ref 135–145)
Total Bilirubin: 0.7 mg/dL (ref 0.2–1.2)
Total Protein: 7.6 g/dL (ref 6.0–8.3)

## 2021-05-29 LAB — CBC
HCT: 37.8 % (ref 36.0–46.0)
Hemoglobin: 12.9 g/dL (ref 12.0–15.0)
MCHC: 34 g/dL (ref 30.0–36.0)
MCV: 89.5 fl (ref 78.0–100.0)
Platelets: 301 10*3/uL (ref 150.0–400.0)
RBC: 4.22 Mil/uL (ref 3.87–5.11)
RDW: 13.8 % (ref 11.5–15.5)
WBC: 5.9 10*3/uL (ref 4.0–10.5)

## 2021-05-29 LAB — LIPID PANEL
Cholesterol: 201 mg/dL — ABNORMAL HIGH (ref 0–200)
HDL: 80.3 mg/dL (ref >39.00)
LDL Cholesterol: 104 mg/dL — ABNORMAL HIGH (ref 0–99)
NonHDL: 120.49
Total CHOL/HDL Ratio: 3
Triglycerides: 84 mg/dL (ref 0.0–149.0)
VLDL: 16.8 mg/dL (ref 0.0–40.0)

## 2021-05-29 NOTE — Patient Instructions (Addendum)
A few things to remember from today's visit: ? ?Routine general medical examination at a health care facility ? ?Essential hypertension, benign - Plan: CBC, Comprehensive metabolic panel ? ?Hyperlipidemia, unspecified hyperlipidemia type - Plan: Lipid panel ? ?Anxiety disorder, unspecified type ? ?If you need refills please call your pharmacy. ?Do not use My Chart to request refills or for acute issues that need immediate attention. ?  ?Please be sure medication list is accurate. ?If a new problem present, please set up appointment sooner than planned today. ? ?Preventive Care 29 Years and Older, Female ?Preventive care refers to lifestyle choices and visits with your health care provider that can promote health and wellness. Preventive care visits are also called wellness exams. ?What can I expect for my preventive care visit? ?Counseling ?Your health care provider may ask you questions about your: ?Medical history, including: ?Past medical problems. ?Family medical history. ?Pregnancy and menstrual history. ?History of falls. ?Current health, including: ?Memory and ability to understand (cognition). ?Emotional well-being. ?Home life and relationship well-being. ?Sexual activity and sexual health. ?Lifestyle, including: ?Alcohol, nicotine or tobacco, and drug use. ?Access to firearms. ?Diet, exercise, and sleep habits. ?Work and work Statistician. ?Sunscreen use. ?Safety issues such as seatbelt and bike helmet use. ?Physical exam ?Your health care provider will check your: ?Height and weight. These may be used to calculate your BMI (body mass index). BMI is a measurement that tells if you are at a healthy weight. ?Waist circumference. This measures the distance around your waistline. This measurement also tells if you are at a healthy weight and may help predict your risk of certain diseases, such as type 2 diabetes and high blood pressure. ?Heart rate and blood pressure. ?Body temperature. ?Skin for abnormal  spots. ?What immunizations do I need? ? ?Vaccines are usually given at various ages, according to a schedule. Your health care provider will recommend vaccines for you based on your age, medical history, and lifestyle or other factors, such as travel or where you work. ?What tests do I need? ?Screening ?Your health care provider may recommend screening tests for certain conditions. This may include: ?Lipid and cholesterol levels. ?Hepatitis C test. ?Hepatitis B test. ?HIV (human immunodeficiency virus) test. ?STI (sexually transmitted infection) testing, if you are at risk. ?Lung cancer screening. ?Colorectal cancer screening. ?Diabetes screening. This is done by checking your blood sugar (glucose) after you have not eaten for a while (fasting). ?Mammogram. Talk with your health care provider about how often you should have regular mammograms. ?BRCA-related cancer screening. This may be done if you have a family history of breast, ovarian, tubal, or peritoneal cancers. ?Bone density scan. This is done to screen for osteoporosis. ?Talk with your health care provider about your test results, treatment options, and if necessary, the need for more tests. ?Follow these instructions at home: ?Eating and drinking ? ?Eat a diet that includes fresh fruits and vegetables, whole grains, lean protein, and low-fat dairy products. Limit your intake of foods with high amounts of sugar, saturated fats, and salt. ?Take vitamin and mineral supplements as recommended by your health care provider. ?Do not drink alcohol if your health care provider tells you not to drink. ?If you drink alcohol: ?Limit how much you have to 0-1 drink a day. ?Know how much alcohol is in your drink. In the [place] one drink equals one 12 oz bottle of beer (355 mL), one 5 oz glass of wine (148 mL), or one 1? oz glass of hard liquor (44 mL). ?Lifestyle ?  Brush your teeth every morning and night with fluoride toothpaste. Floss one time each day. ?Exercise for at  least 30 minutes 5 or more days each week. ?Do not use any products that contain nicotine or tobacco. These products include cigarettes, chewing tobacco, and vaping devices, such as e-cigarettes. If you need help quitting, ask your health care provider. ?Do not use drugs. ?If you are sexually active, practice safe sex. Use a condom or other form of protection in order to prevent STIs. ?Take aspirin only as told by your health care provider. Make sure that you understand how much to take and what form to take. Work with your health care provider to find out whether it is safe and beneficial for you to take aspirin daily. ?Ask your health care provider if you need to take a cholesterol-lowering medicine (statin). ?Find healthy ways to manage stress, such as: ?Meditation, yoga, or listening to music. ?Journaling. ?Talking to a trusted person. ?Spending time with friends and family. ?Minimize exposure to UV radiation to reduce your risk of skin cancer. ?Safety ?Always wear your seat belt while driving or riding in a vehicle. ?Do not drive: ?If you have been drinking alcohol. Do not ride with someone who has been drinking. ?When you are tired or distracted. ?While texting. ?If you have been using any mind-altering substances or drugs. ?Wear a helmet and other protective equipment during sports activities. ?If you have firearms in your house, make sure you follow all gun safety procedures. ?What's next? ?Visit your health care provider once a year for an annual wellness visit. ?Ask your health care provider how often you should have your eyes and teeth checked. ?Stay up to date on all vaccines. ?This information is not intended to replace advice given to you by your health care provider. Make sure you discuss any questions you have with your health care provider. ?Document Revised: 07/06/2020 Document Reviewed: 07/06/2020 ?Elsevier Patient Education ? [place]. ? ? ? ? ? ?

## 2021-06-01 MED ORDER — SIMVASTATIN 10 MG PO TABS: ORAL_TABLET | ORAL | 3 refills | Status: DC

## 2021-06-01 MED ORDER — LORAZEPAM 0.5 MG PO TABS: 0.2500 mg | ORAL_TABLET | Freq: Every day | ORAL | 2 refills | Status: DC | PRN

## 2021-06-01 MED ORDER — LISINOPRIL-HYDROCHLOROTHIAZIDE 20-12.5 MG PO TABS: 1.0000 | ORAL_TABLET | Freq: Every day | ORAL | 3 refills | Status: DC

## 2021-06-29 ENCOUNTER — Ambulatory Visit
Admission: RE | Admit: 2021-06-29 | Discharge: 2021-06-29 | Disposition: A | Discharge status: Home / Self Care | Payer: Medicare PPO | Source: Ambulatory Visit | Attending: Family Medicine | Admitting: Family Medicine

## 2021-06-29 DIAGNOSIS — Z1231 Encounter for screening mammogram for malignant neoplasm of breast: Secondary | ICD-10-CM

## 2021-07-03 DIAGNOSIS — H25813 Combined forms of age-related cataract, bilateral: Secondary | ICD-10-CM | POA: Diagnosis not present

## 2021-07-03 DIAGNOSIS — H25811 Combined forms of age-related cataract, right eye: Secondary | ICD-10-CM | POA: Diagnosis not present

## 2021-07-18 DIAGNOSIS — H25811 Combined forms of age-related cataract, right eye: Secondary | ICD-10-CM | POA: Diagnosis not present

## 2021-07-18 DIAGNOSIS — H269 Unspecified cataract: Secondary | ICD-10-CM | POA: Diagnosis not present

## 2021-07-20 ENCOUNTER — Telehealth: Payer: Self-pay | Admitting: Family Medicine

## 2021-07-20 NOTE — Telephone Encounter (Signed)
Spoke with patient to schedule awv she has a lot going on wanted a call in the fall

## 2021-07-31 DIAGNOSIS — H25812 Combined forms of age-related cataract, left eye: Secondary | ICD-10-CM | POA: Diagnosis not present

## 2021-08-15 DIAGNOSIS — H269 Unspecified cataract: Secondary | ICD-10-CM | POA: Diagnosis not present

## 2021-08-15 DIAGNOSIS — H268 Other specified cataract: Secondary | ICD-10-CM | POA: Diagnosis not present

## 2021-08-15 DIAGNOSIS — H25812 Combined forms of age-related cataract, left eye: Secondary | ICD-10-CM | POA: Diagnosis not present

## 2021-08-17 ENCOUNTER — Telehealth: Payer: Self-pay | Admitting: Family Medicine

## 2021-08-17 NOTE — Telephone Encounter (Signed)
Pt read an article (published by [facility] study) that stated she should not take medication with hctz as it is an increased risk of skin cancer and she has been diagnosed with skin cancer previously. Checking to see if she she needs to discontinue taking lisinopril-hydrochlorothiazide, requesting a call.

## 2021-08-22 NOTE — Telephone Encounter (Signed)
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 I am not sure about the percentage (she can ask her dermatologist) but risk goes up after taking medication for years; so I think it is appropriate to change for a different antihypertensive medication. Lisinopril same dose. Can try Amlodipine 2.5 mg daily. Monitor BP regularly. F/U in 6 weeks. Thanks, BJ

## 2021-08-23 MED ORDER — LISINOPRIL 20 MG PO TABS: 20.0000 mg | ORAL_TABLET | Freq: Every day | ORAL | 3 refills | Status: DC

## 2021-08-23 MED ORDER — AMLODIPINE BESYLATE 2.5 MG PO TABS: 2.5000 mg | ORAL_TABLET | Freq: Every day | ORAL | 3 refills | Status: DC

## 2021-08-23 NOTE — Addendum Note (Signed)
Addended by: Rodrigo Ran on: 08/23/2021 09:09 AM   Modules accepted: Orders

## 2021-08-23 NOTE — Telephone Encounter (Signed)
I spoke with patient. We went over the information below & she verbalized understanding. Rx's sent in. F/u set up 8/30 before she leaves to go stay with her son & grandchildren.

## 2021-09-12 NOTE — Telephone Encounter (Signed)
Pt has appt on 09/26/21 with PCP.   Notified of above & agreeable to scheduling tele AWV on same day. Appt scheduled for 10a verified best number to reach patient.

## 2021-09-20 ENCOUNTER — Ambulatory Visit: Payer: Medicare PPO | Admitting: Family Medicine

## 2021-09-26 ENCOUNTER — Encounter: Payer: Self-pay | Admitting: Family Medicine

## 2021-09-26 ENCOUNTER — Ambulatory Visit: Payer: Medicare PPO

## 2021-09-26 ENCOUNTER — Ambulatory Visit: Payer: Medicare PPO | Admitting: Family Medicine

## 2021-09-26 VITALS — BP 124/80 | HR 91 | Temp 98.5°F | Resp 12 | Ht 64.0 in | Wt 129.4 lb

## 2021-09-26 DIAGNOSIS — F419 Anxiety disorder, unspecified: Secondary | ICD-10-CM

## 2021-09-26 DIAGNOSIS — I10 Essential (primary) hypertension: Secondary | ICD-10-CM

## 2021-09-26 LAB — BASIC METABOLIC PANEL
BUN: 11 mg/dL (ref 6–23)
CO2: 23 mEq/L (ref 19–32)
Calcium: 9.4 mg/dL (ref 8.4–10.5)
Chloride: 99 mEq/L (ref 96–112)
Creatinine, Ser: 0.78 mg/dL (ref 0.40–1.20)
GFR: 73.84 mL/min (ref >60.00)
Glucose, Bld: 85 mg/dL (ref 70–99)
Potassium: 3.9 mEq/L (ref 3.5–5.1)
Sodium: 133 mEq/L — ABNORMAL LOW (ref 135–145)

## 2021-09-26 NOTE — Assessment & Plan Note (Signed)
BP adequately controlled. Continue Amlodipine 2.5 mg daily and Lisinopril 20 mg daily. Low salt diet. Continue monitoring BP regularly.

## 2021-09-26 NOTE — Assessment & Plan Note (Signed)
Problem is stable. No changes in Lorazepam dose. She does not take medication often, so we can continue following annually.

## 2021-09-26 NOTE — Progress Notes (Signed)
Nicole [name] is a 76 y.o.female, who is here today to follow on HTN.  Last follow up visit: 05/29/21 Since her last visit her HCTZ was discontinued, she was concerned about increasing her risk for skin cancer. Amlodipine 2.5 mg was started. She is also on Lisinopril 20 mg daily. She has tolerated new medication well.  BP readings at home:120-130's/70's. Negative for unusual or severe headache, visual changes, exertional chest pain, dyspnea,  focal weakness, or edema.  Lab Results  Component Value Date   CREATININE 0.93 05/29/2021   BUN 17 05/29/2021   NA 134 (L) 05/29/2021   K 4.2 05/29/2021   CL 98 05/29/2021   CO2 27 05/29/2021   Anxiety: She takes Lorazepam 0.5 mg 1/2-1 tab daily as needed. Negative for depressed mood.     09/26/2021    2:34 PM 05/29/2021    8:04 AM 07/26/2020    9:07 AM 05/24/2020    9:46 AM 04/15/2019   10:01 AM  Depression screen PHQ 2/9  Decreased Interest 0 0 0 0 0  Down, Depressed, Hopeless 0 0 0 0 0  PHQ - 2 Score 0 0 0 0 0   Review of Systems  Constitutional:  Negative for activity change, appetite change and fever.  HENT:  Negative for mouth sores, nosebleeds and sore throat.   Respiratory:  Negative for cough and wheezing.   Gastrointestinal:  Negative for abdominal pain, nausea and vomiting.       Negative for changes in bowel habits.  Genitourinary:  Negative for decreased urine volume and hematuria.  Neurological:  Negative for syncope and facial asymmetry.  Psychiatric/Behavioral:  Negative for confusion and hallucinations.   Rest see pertinent positives and negatives per HPI.  Current Outpatient Medications on File Prior to Visit  Medication Sig Dispense Refill   amLODipine (NORVASC) 2.5 MG tablet Take 1 tablet (2.5 mg total) by mouth daily. 30 tablet 3   aspirin 81 MG tablet Take 81 mg by mouth daily.      lisinopril (ZESTRIL) 20 MG tablet Take 1 tablet (20 mg total) by mouth daily. 30 tablet 3   LORazepam (ATIVAN) 0.5 MG tablet  Take 0.5-1 tablets (0.25-0.5 mg total) by mouth daily as needed for anxiety. A year supply. 30 tablet 2   simvastatin (ZOCOR) 10 MG tablet 1 tab 3 times per week: Monday, Wednesday,and Saturday. 36 tablet 3   No current facility-administered medications on file prior to visit.   Past Medical History:  Diagnosis Date   Allergic rhinitis    Dislocated shoulder    right    Flying phobia    General anxieety also-xanax prn   Hematuria    asymptomatic   Hx of colonic polyps    Hypertension    Allergies  Allergen Reactions   Oysters [Shellfish Allergy] Swelling    Swelling of lips next day after eating oysters   Penicillins Hives   Sulfamethoxazole-Trimethoprim Other (See Comments)    REACTION: turned firey red   Social History   Socioeconomic History   Marital status: Married    Spouse name: Not on file   Number of children: Not on file   Years of education: Not on file   Highest education level: Not on file  Occupational History   Not on file  Tobacco Use   Smoking status: Former   Smokeless tobacco: Never  Substance and Sexual Activity   Alcohol use: Yes    Comment: Occasionnally-social   Drug use: No  Sexual activity: Not on file  Other Topics Concern   Not on file  Social History Narrative   Not on file   Social Determinants of Health   Financial Resource Strain: Low Risk  (07/26/2020)   Overall Financial Resource Strain (CARDIA)    Difficulty of Paying Living Expenses: Not hard at all  Food Insecurity: No Food Insecurity (07/26/2020)   Hunger Vital Sign    Worried About Running Out of Food in the Last Year: Never true    Ran Out of Food in the Last Year: Never true  Transportation Needs: No Transportation Needs (07/26/2020)   PRAPARE - Hydrologist (Medical): No    Lack of Transportation (Non-Medical): No  Physical Activity: Sufficiently Active (07/26/2020)   Exercise Vital Sign    Days of Exercise per Week: 7 days    Minutes of  Exercise per Session: 30 min  Stress: No Stress Concern Present (07/26/2020)   [place]    Feeling of Stress : Not at all  Social Connections: Moderately Integrated (07/26/2020)   Social Connection and Isolation Panel [NHANES]    Frequency of Communication with Friends and Family: Three times a week    Frequency of Social Gatherings with Friends and Family: Three times a week    Attends Religious Services: Never    Active Member of Clubs or Organizations: Yes    Attends Archivist Meetings: More than 4 times per year    Marital Status: Married   Vitals:   09/26/21 1429  BP: 124/80  Pulse: 91  Resp: 12  Temp: 98.5 F (36.9 C)  SpO2: 97%   Body mass index is 22.21 kg/m.  Physical Exam Vitals and nursing note reviewed.  Constitutional:      General: She is not in acute distress.    Appearance: She is well-developed.  HENT:     Head: Normocephalic and atraumatic.     Mouth/Throat:     Mouth: Mucous membranes are moist.     Pharynx: Oropharynx is clear.  Eyes:     Conjunctiva/sclera: Conjunctivae normal.  Cardiovascular:     Rate and Rhythm: Normal rate and regular rhythm.     Pulses:          Dorsalis pedis pulses are 2+ on the right side and 2+ on the left side.     Heart sounds: No murmur heard. Pulmonary:     Effort: Pulmonary effort is normal. No respiratory distress.     Breath sounds: Normal breath sounds.  Abdominal:     Palpations: Abdomen is soft. There is no hepatomegaly or mass.     Tenderness: There is no abdominal tenderness.  Lymphadenopathy:     Cervical: No cervical adenopathy.  Skin:    General: Skin is warm.     Findings: No erythema or rash.  Neurological:     General: No focal deficit present.     Mental Status: She is alert and oriented to person, place, and time.     Cranial Nerves: No cranial nerve deficit.     Gait: Gait normal.  Psychiatric:     Comments: Well  groomed, good eye contact.   ASSESSMENT AND PLAN:   Nicole [name] was seen today for follow-up.  Diagnoses and all orders for this visit:  Orders Placed This Encounter  Procedures   Basic metabolic panel   Lab Results  Component Value Date   CREATININE 0.78 09/26/2021  BUN 11 09/26/2021   NA 133 (L) 09/26/2021   K 3.9 09/26/2021   CL 99 09/26/2021   CO2 23 09/26/2021   Essential hypertension, benign BP adequately controlled. Continue Amlodipine 2.5 mg daily and Lisinopril 20 mg daily. Low salt diet. Continue monitoring BP regularly.  Anxiety disorder, unspecified Problem is stable. No changes in Lorazepam dose. She does not take medication often, so we can continue following annually.  Return in about 8 months (around 06/01/2022) for cpe and f/u.  Armen Waring G. [place], MD  [facility]. [place] office.

## 2021-09-27 ENCOUNTER — Ambulatory Visit: Payer: Medicare PPO | Admitting: Adult Health

## 2021-09-27 ENCOUNTER — Ambulatory Visit: Payer: Self-pay

## 2021-09-27 ENCOUNTER — Ambulatory Visit: Payer: Medicare PPO | Admitting: Family Medicine

## 2021-09-27 VITALS — BP 142/84 | HR 97 | Temp 98.4°F | Wt 127.2 lb

## 2021-09-27 DIAGNOSIS — M270 Developmental disorders of jaws: Secondary | ICD-10-CM

## 2021-09-27 NOTE — Progress Notes (Signed)
Subjective:    Patient ID: ATHINA FAHEY, female    DOB: 09/28/1945, 76 y.o.   MRN: [id number]  HPI 76 year old female who  has a past medical history of Allergic rhinitis, Dislocated shoulder, Flying phobia, Hematuria, colonic polyps, and Hypertension.  She presents to the office today for an acute concern, she reports yesterday she noticed a hard nodule on the roof of her mouth.  It is not painful or affect her breathing or eating.   Review of Systems See HPI   Past Medical History:  Diagnosis Date   Allergic rhinitis    Dislocated shoulder    right    Flying phobia    General anxieety also-xanax prn   Hematuria    asymptomatic   Hx of colonic polyps    Hypertension     Social History   Socioeconomic History   Marital status: Married    Spouse name: Not on file   Number of children: Not on file   Years of education: Not on file   Highest education level: Not on file  Occupational History   Not on file  Tobacco Use   Smoking status: Former   Smokeless tobacco: Never  Substance and Sexual Activity   Alcohol use: Yes    Comment: Occasionnally-social   Drug use: No   Sexual activity: Not on file  Other Topics Concern   Not on file  Social History Narrative   Not on file   Social Determinants of Health   Financial Resource Strain: Low Risk  (07/26/2020)   Overall Financial Resource Strain (CARDIA)    Difficulty of Paying Living Expenses: Not hard at all  Food Insecurity: No Food Insecurity (07/26/2020)   Hunger Vital Sign    Worried About Running Out of Food in the Last Year: Never true    [place] in the Last Year: Never true  Transportation Needs: No Transportation Needs (07/26/2020)   PRAPARE - Hydrologist (Medical): No    Lack of Transportation (Non-Medical): No  Physical Activity: Sufficiently Active (07/26/2020)   Exercise Vital Sign    Days of Exercise per Week: 7 days    Minutes of Exercise per Session: 30 min   Stress: No Stress Concern Present (07/26/2020)   [place]    Feeling of Stress : Not at all  Social Connections: Moderately Integrated (07/26/2020)   Social Connection and Isolation Panel [NHANES]    Frequency of Communication with Friends and Family: Three times a week    Frequency of Social Gatherings with Friends and Family: Three times a week    Attends Religious Services: Never    Active Member of Clubs or Organizations: Yes    Attends Archivist Meetings: More than 4 times per year    Marital Status: Married  Human resources officer Violence: Not At Risk (07/26/2020)   Humiliation, Afraid, Rape, and Kick questionnaire    Fear of Current or Ex-Partner: No    Emotionally Abused: No    Physically Abused: No    Sexually Abused: No    Past Surgical History:  Procedure Laterality Date   HERNIA REPAIR  1999   Dr. Lara Mulch   LAPAROSCOPY Right 08/07/2012   Procedure: LAPAROSCOPY OPERATIVE;  Surgeon: Elveria Royals, MD;  Location: [facility];  Service: Gynecology;  Laterality: Right;  Removal Right Ovarian Cyst Wall, Pelvic Washings    Family History  Problem  Relation Age of Onset   Hypertension Mother    Osteoporosis Mother    Lymphoma Father    Breast cancer Maternal Aunt 30    Allergies  Allergen Reactions   Oysters [Shellfish Allergy] Swelling    Swelling of lips next day after eating oysters   Penicillins Hives   Sulfamethoxazole-Trimethoprim Other (See Comments)    REACTION: turned firey red    Current Outpatient Medications on File Prior to Visit  Medication Sig Dispense Refill   amLODipine (NORVASC) 2.5 MG tablet Take 1 tablet (2.5 mg total) by mouth daily. 30 tablet 3   aspirin 81 MG tablet Take 81 mg by mouth daily.      lisinopril (ZESTRIL) 20 MG tablet Take 1 tablet (20 mg total) by mouth daily. 30 tablet 3   LORazepam (ATIVAN) 0.5 MG tablet Take 0.5-1 tablets (0.25-0.5 mg total) by  mouth daily as needed for anxiety. A year supply. 30 tablet 2   simvastatin (ZOCOR) 10 MG tablet 1 tab 3 times per week: Monday, Wednesday,and Saturday. 36 tablet 3   No current facility-administered medications on file prior to visit.    BP (!) 142/84 (BP Location: Left Arm, Patient Position: Sitting, Cuff Size: Normal)   Pulse 97   Temp 98.4 F (36.9 C) (Oral)   Wt 127 lb 3.2 oz (57.7 kg)   SpO2 98%   BMI 21.83 kg/m       Objective:   Physical Exam Vitals and nursing note reviewed.  Constitutional:      Appearance: Normal appearance.  HENT:     Mouth/Throat:   Neurological:     Mental Status: She is alert.        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  Since it is not interfering with her life then I would recommend not doing anything and just being an eye on growth at this time   Dorothyann Peng, NP

## 2021-09-28 MED ORDER — AMLODIPINE BESYLATE 2.5 MG PO TABS: 2.5000 mg | ORAL_TABLET | Freq: Every day | ORAL | 2 refills | Status: DC

## 2021-09-28 MED ORDER — LISINOPRIL 20 MG PO TABS: 20.0000 mg | ORAL_TABLET | Freq: Every day | ORAL | 2 refills | Status: DC

## 2021-11-27 ENCOUNTER — Other Ambulatory Visit: Payer: Self-pay | Admitting: Family Medicine

## 2021-11-27 DIAGNOSIS — I10 Essential (primary) hypertension: Secondary | ICD-10-CM

## 2022-02-12 DIAGNOSIS — Z961 Presence of intraocular lens: Secondary | ICD-10-CM | POA: Diagnosis not present

## 2022-02-12 DIAGNOSIS — H43813 Vitreous degeneration, bilateral: Secondary | ICD-10-CM | POA: Diagnosis not present

## 2022-03-12 ENCOUNTER — Other Ambulatory Visit: Payer: Self-pay | Admitting: Family Medicine

## 2022-03-12 DIAGNOSIS — F5105 Insomnia due to other mental disorder: Secondary | ICD-10-CM

## 2022-03-12 NOTE — Telephone Encounter (Signed)
Last filled 09/21/21

## 2022-03-13 ENCOUNTER — Other Ambulatory Visit: Payer: Self-pay | Admitting: Family Medicine

## 2022-03-13 DIAGNOSIS — Z1231 Encounter for screening mammogram for malignant neoplasm of breast: Secondary | ICD-10-CM

## 2022-04-24 ENCOUNTER — Telehealth: Payer: Self-pay | Admitting: Family Medicine

## 2022-04-24 NOTE — Telephone Encounter (Signed)
Pt requesting an antibiotic prior to her getting her teeth cleaned next week. States her spouse got his teeth cleaned last hear and his gums were infected horribly afterwards.

## 2022-04-25 NOTE — Telephone Encounter (Signed)
This is something that can be addressed by her dentist, who can prescribe antibiotics if it is considered appropriate. Thanks, BJ

## 2022-04-30 NOTE — Telephone Encounter (Signed)
I left patient a voicemail letting her know Dr. [place] doesn't prescribe antibiotics prior to dental cleanings, but if the dentist sees her gums are infected, they typically prescribe medication to take care of this.

## 2022-06-04 DIAGNOSIS — D2271 Melanocytic nevi of right lower limb, including hip: Secondary | ICD-10-CM | POA: Diagnosis not present

## 2022-06-04 DIAGNOSIS — D225 Melanocytic nevi of trunk: Secondary | ICD-10-CM | POA: Diagnosis not present

## 2022-06-04 DIAGNOSIS — Z85828 Personal history of other malignant neoplasm of skin: Secondary | ICD-10-CM | POA: Diagnosis not present

## 2022-06-04 DIAGNOSIS — Z85820 Personal history of malignant melanoma of skin: Secondary | ICD-10-CM | POA: Diagnosis not present

## 2022-06-04 DIAGNOSIS — E755 Other lipid storage disorders: Secondary | ICD-10-CM | POA: Diagnosis not present

## 2022-06-04 DIAGNOSIS — D2262 Melanocytic nevi of left upper limb, including shoulder: Secondary | ICD-10-CM | POA: Diagnosis not present

## 2022-06-04 DIAGNOSIS — D224 Melanocytic nevi of scalp and neck: Secondary | ICD-10-CM | POA: Diagnosis not present

## 2022-06-04 DIAGNOSIS — L821 Other seborrheic keratosis: Secondary | ICD-10-CM | POA: Diagnosis not present

## 2022-06-04 NOTE — Progress Notes (Unsigned)
HPI: Nicole [name] is a 77 y.o. female, who is here today for her routine physical.  Last CPE: 05/29/21  Regular exercise 3 or more time per week: *** Following a healthy diet: ***  Chronic medical problems: ***  Immunization History  Administered Date(s) Administered  . Covid-19, Mrna,Vaccine(Spikevax)74yrs and older 04/26/2022  . Influenza,inj,Quad PF,6+ Mos 12/23/2021  . Influenza-Unspecified 12/05/2020  . PFIZER Comirnaty(Gray Top)Covid-19 Tri-Sucrose Vaccine 04/26/2020  . PFIZER(Purple Top)SARS-COV-2 Vaccination 02/16/2019, 03/09/2019  . Pneumococcal Conjugate-13 08/28/2016  . Pneumococcal Polysaccharide-23 05/21/2008, 01/20/2018  . Respiratory Syncytial Virus Vaccine,Recomb Aduvanted(Arexvy) 09/22/2021  . Td 10/23/2005, 09/07/2009   Health Maintenance  Topic Date Due  . Zoster Vaccines- Shingrix (1 of 2) Never done  . DTaP/Tdap/Td (3 - Tdap) 09/08/2019  . Medicare Annual Wellness (AWV)  07/26/2021  . COVID-19 Vaccine (5 - 2023-24 season) 06/21/2022  . INFLUENZA VACCINE  08/23/2022  . Pneumonia Vaccine 39+ Years old  Completed  . DEXA SCAN  Completed  . Hepatitis C Screening  Completed  . HPV VACCINES  Aged Out  . COLONOSCOPY (Pts 45-77yrs Insurance coverage will need to be confirmed)  Discontinued    She has *** concerns today.  Review of Systems  Current Outpatient Medications on File Prior to Visit  Medication Sig Dispense Refill  . amLODipine (NORVASC) 2.5 MG tablet TAKE 1 TABLET BY MOUTH EVERY DAY 90 tablet 1  . aspirin 81 MG tablet Take 81 mg by mouth daily.     Marland Kitchen lisinopril (ZESTRIL) 20 MG tablet TAKE 1 TABLET BY MOUTH EVERY DAY 90 tablet 1  . LORazepam (ATIVAN) 0.5 MG tablet TAKE 1/2 TO 1 TABLET BY MOUTH DAILY AS NEEDED FOR ANXIETY 30 tablet 0  . simvastatin (ZOCOR) 10 MG tablet 1 tab 3 times per week: Monday, Wednesday,and Saturday. 36 tablet 3   No current facility-administered medications on file prior to visit.    Past Medical History:   Diagnosis Date  . Allergic rhinitis   . Dislocated shoulder    right   . Flying phobia    General anxieety also-xanax prn  . Hematuria    asymptomatic  . Hx of colonic polyps   . Hypertension     Past Surgical History:  Procedure Laterality Date  . HERNIA REPAIR  1999   Dr. Noni Saupe  . LAPAROSCOPY Right 08/07/2012   Procedure: LAPAROSCOPY OPERATIVE;  Surgeon: Robley Fries, MD;  Location: [facility];  Service: Gynecology;  Laterality: Right;  Removal Right Ovarian Cyst Wall, Pelvic Washings    Allergies  Allergen Reactions  . Oysters [Shellfish Allergy] Swelling    Swelling of lips next day after eating oysters  . Penicillins Hives  . Sulfamethoxazole-Trimethoprim Other (See Comments)    REACTION: turned firey red    Family History  Problem Relation Age of Onset  . Hypertension Mother   . Osteoporosis Mother   . Lymphoma Father   . Breast cancer Maternal Aunt 30    Social History   Socioeconomic History  . Marital status: Married    Spouse name: Not on file  . Number of children: Not on file  . Years of education: Not on file  . Highest education level: Not on file  Occupational History  . Not on file  Tobacco Use  . Smoking status: Former  . Smokeless tobacco: Never  Substance and Sexual Activity  . Alcohol use: Yes    Comment: Occasionnally-social  . Drug use: No  . Sexual activity: Not on file  Other Topics Concern  . Not on file  Social History Narrative  . Not on file   Social Determinants of Health   Financial Resource Strain: Low Risk  (07/26/2020)   Overall Financial Resource Strain (CARDIA)   . Difficulty of Paying Living Expenses: Not hard at all  Food Insecurity: No Food Insecurity (07/26/2020)   Hunger Vital Sign   . Worried About Programme researcher, broadcasting/film/video in the Last Year: Never true   . Ran Out of Food in the Last Year: Never true  Transportation Needs: No Transportation Needs (07/26/2020)   PRAPARE - Transportation   . Lack of  Transportation (Medical): No   . Lack of Transportation (Non-Medical): No  Physical Activity: Sufficiently Active (07/26/2020)   Exercise Vital Sign   . Days of Exercise per Week: 7 days   . Minutes of Exercise per Session: 30 min  Stress: No Stress Concern Present (07/26/2020)   [employer] of Occupational Health - Occupational Stress Questionnaire   . Feeling of Stress : Not at all  Social Connections: Moderately Integrated (07/26/2020)   Social Connection and Isolation Panel [NHANES]   . Frequency of Communication with Friends and Family: Three times a week   . Frequency of Social Gatherings with Friends and Family: Three times a week   . Attends Religious Services: Never   . Active Member of Clubs or Organizations: Yes   . Attends Banker Meetings: More than 4 times per year   . Marital Status: Married    There were no vitals filed for this visit. There is no height or weight on file to calculate BMI.  Wt Readings from Last 3 Encounters:  09/27/21 127 lb 3.2 oz (57.7 kg)  09/26/21 129 lb 6 oz (58.7 kg)  05/29/21 126 lb (57.2 kg)    Physical Exam Vitals and nursing note reviewed.  Constitutional:      General: She is not in acute distress.    Appearance: She is well-developed.  HENT:     Head: Normocephalic and atraumatic.     Right Ear: Hearing, tympanic membrane, ear canal and external ear normal.     Left Ear: Hearing, tympanic membrane, ear canal and external ear normal.     Mouth/Throat:     Mouth: Mucous membranes are moist.     Pharynx: Oropharynx is clear. Uvula midline.  Eyes:     Extraocular Movements: Extraocular movements intact.     Conjunctiva/sclera: Conjunctivae normal.     Pupils: Pupils are equal, round, and reactive to light.  Neck:     Thyroid: No thyromegaly.     Trachea: No tracheal deviation.  Cardiovascular:     Rate and Rhythm: Normal rate and regular rhythm.     Pulses:          Dorsalis pedis pulses are 2+ on the right side  and 2+ on the left side.       Posterior tibial pulses are 2+ on the right side and 2+ on the left side.     Heart sounds: No murmur heard. Pulmonary:     Effort: Pulmonary effort is normal. No respiratory distress.     Breath sounds: Normal breath sounds.  Abdominal:     Palpations: Abdomen is soft. There is no hepatomegaly or mass.     Tenderness: There is no abdominal tenderness.  Genitourinary:    Comments: Deferred to gyn. Musculoskeletal:     Comments: No major deformity or signs of synovitis appreciated.  Lymphadenopathy:  Cervical: No cervical adenopathy.     Upper Body:     Right upper body: No supraclavicular adenopathy.     Left upper body: No supraclavicular adenopathy.  Skin:    General: Skin is warm.     Findings: No erythema or rash.  Neurological:     General: No focal deficit present.     Mental Status: She is alert and oriented to person, place, and time.     Cranial Nerves: No cranial nerve deficit.     Coordination: Coordination normal.     Gait: Gait normal.     Deep Tendon Reflexes:     Reflex Scores:      Bicep reflexes are 2+ on the right side and 2+ on the left side.      Patellar reflexes are 2+ on the right side and 2+ on the left side. Psychiatric:     Comments: Well groomed, good eye contact.   ASSESSMENT AND PLAN: Nicole [name] was here today annual physical examination.  No orders of the defined types were placed in this encounter.   There are no diagnoses linked to this encounter.  There are no diagnoses linked to this encounter.  No follow-ups on file.  Perri Aragones G. [place], MD  [facility]. [place] office.

## 2022-06-05 ENCOUNTER — Ambulatory Visit (INDEPENDENT_AMBULATORY_CARE_PROVIDER_SITE_OTHER): Payer: Medicare PPO | Admitting: Family Medicine

## 2022-06-05 ENCOUNTER — Encounter: Payer: Self-pay | Admitting: Family Medicine

## 2022-06-05 VITALS — BP 120/70 | HR 91 | Temp 98.5°F | Resp 16 | Ht 64.0 in | Wt 125.1 lb

## 2022-06-05 DIAGNOSIS — R7309 Other abnormal glucose: Secondary | ICD-10-CM

## 2022-06-05 DIAGNOSIS — E785 Hyperlipidemia, unspecified: Secondary | ICD-10-CM

## 2022-06-05 DIAGNOSIS — I10 Essential (primary) hypertension: Secondary | ICD-10-CM

## 2022-06-05 DIAGNOSIS — F419 Anxiety disorder, unspecified: Secondary | ICD-10-CM

## 2022-06-05 DIAGNOSIS — Z0000 Encounter for general adult medical examination without abnormal findings: Secondary | ICD-10-CM

## 2022-06-05 LAB — LIPID PANEL
Cholesterol: 202 mg/dL — ABNORMAL HIGH (ref 0–200)
HDL: 73.2 mg/dL (ref >39.00)
LDL Cholesterol: 116 mg/dL — ABNORMAL HIGH (ref 0–99)
NonHDL: 128.49
Total CHOL/HDL Ratio: 3
Triglycerides: 63 mg/dL (ref 0.0–149.0)
VLDL: 12.6 mg/dL (ref 0.0–40.0)

## 2022-06-05 LAB — COMPREHENSIVE METABOLIC PANEL
ALT: 16 U/L (ref 0–35)
AST: 21 U/L (ref 0–37)
Albumin: 4.3 g/dL (ref 3.5–5.2)
Alkaline Phosphatase: 55 U/L (ref 39–117)
BUN: 16 mg/dL (ref 6–23)
CO2: 26 mEq/L (ref 19–32)
Calcium: 9.5 mg/dL (ref 8.4–10.5)
Chloride: 103 mEq/L (ref 96–112)
Creatinine, Ser: 0.87 mg/dL (ref 0.40–1.20)
GFR: 64.46 mL/min (ref >60.00)
Glucose, Bld: 93 mg/dL (ref 70–99)
Potassium: 4.2 mEq/L (ref 3.5–5.1)
Sodium: 138 mEq/L (ref 135–145)
Total Bilirubin: 0.6 mg/dL (ref 0.2–1.2)
Total Protein: 7.2 g/dL (ref 6.0–8.3)

## 2022-06-05 LAB — HEMOGLOBIN A1C: Hgb A1c MFr Bld: 5.6 % (ref 4.6–6.5)

## 2022-06-05 MED ORDER — AMLODIPINE BESYLATE 2.5 MG PO TABS: 2.5000 mg | ORAL_TABLET | Freq: Every day | ORAL | 3 refills | Status: DC

## 2022-06-05 MED ORDER — SIMVASTATIN 10 MG PO TABS: ORAL_TABLET | ORAL | 3 refills | Status: DC

## 2022-06-05 NOTE — Patient Instructions (Signed)
A few things to remember from today's visit:  Routine general medical examination at a health care facility  Hyperlipidemia, unspecified hyperlipidemia type  Essential hypertension, benign  If you need refills for medications you take chronically, please call your pharmacy. Do not use My Chart to request refills or for acute issues that need immediate attention. If you send a my chart message, it may take a few days to be addressed, specially if I am not in the office.  Please be sure medication list is accurate. If a new problem present, please set up appointment sooner than planned today.

## 2022-06-05 NOTE — Assessment & Plan Note (Addendum)
Continue simvastatin 10 mg 3 times per week and low-fat diet. Daily medication causes muscles aches. Further recommendation will be given according to lipid panel result.

## 2022-06-05 NOTE — Assessment & Plan Note (Signed)
We discussed the importance of regular physical activity and healthy diet for prevention of chronic illness and/or complications. Preventive guidelines reviewed. Vaccination: Recommend getting Shingrix at her pharmacy. Ca++ and vit D supplementation to continue. Next CPE in a year.

## 2022-06-05 NOTE — Assessment & Plan Note (Addendum)
Exacerbated a few months ago due to has been health complications, problem is back to its baseline. Continue lorazepam 0.5 mg 1/2 to 1 tablet daily as needed.   As far as prescription for 30 x 2 refill lasts a year, we can continue following annually, before if problem gets worse.

## 2022-06-05 NOTE — Assessment & Plan Note (Addendum)
BP is adequately controlled. Continue lisinopril 20 mg daily and amlodipine 2.5 mg daily. Monitor blood pressure regularly and continue low-salt diet. Eye exam is current. As far as BP is adequately controlled at home, we can go back to annual follow-ups.

## 2022-06-27 ENCOUNTER — Other Ambulatory Visit: Payer: Self-pay | Admitting: Family Medicine

## 2022-06-27 DIAGNOSIS — F5105 Insomnia due to other mental disorder: Secondary | ICD-10-CM

## 2022-06-28 NOTE — Telephone Encounter (Signed)
Last refill: 03/17/22 Last OV: 06/05/22 (cpe) Next OV: none scheduled

## 2022-07-03 ENCOUNTER — Ambulatory Visit
Admission: RE | Admit: 2022-07-03 | Discharge: 2022-07-03 | Disposition: A | Discharge status: Home / Self Care | Payer: Medicare PPO | Source: Ambulatory Visit | Attending: Family Medicine | Admitting: Family Medicine

## 2022-07-03 DIAGNOSIS — Z1231 Encounter for screening mammogram for malignant neoplasm of breast: Secondary | ICD-10-CM | POA: Diagnosis not present

## 2022-09-15 ENCOUNTER — Other Ambulatory Visit: Payer: Self-pay | Admitting: Family Medicine

## 2022-12-07 DIAGNOSIS — H43813 Vitreous degeneration, bilateral: Secondary | ICD-10-CM | POA: Diagnosis not present

## 2022-12-07 DIAGNOSIS — H02824 Cysts of left upper eyelid: Secondary | ICD-10-CM | POA: Diagnosis not present

## 2022-12-07 DIAGNOSIS — D3132 Benign neoplasm of left choroid: Secondary | ICD-10-CM | POA: Diagnosis not present

## 2022-12-07 DIAGNOSIS — Z961 Presence of intraocular lens: Secondary | ICD-10-CM | POA: Diagnosis not present

## 2023-01-06 ENCOUNTER — Other Ambulatory Visit: Payer: Self-pay | Admitting: Family Medicine

## 2023-01-06 DIAGNOSIS — F5105 Insomnia due to other mental disorder: Secondary | ICD-10-CM

## 2023-04-08 ENCOUNTER — Other Ambulatory Visit: Payer: Self-pay | Admitting: Family Medicine

## 2023-04-08 DIAGNOSIS — F5105 Insomnia due to other mental disorder: Secondary | ICD-10-CM

## 2023-04-16 ENCOUNTER — Other Ambulatory Visit: Payer: Self-pay | Admitting: Family Medicine

## 2023-04-16 DIAGNOSIS — Z0000 Encounter for general adult medical examination without abnormal findings: Secondary | ICD-10-CM

## 2023-05-22 ENCOUNTER — Other Ambulatory Visit: Payer: Self-pay | Admitting: Family Medicine

## 2023-05-31 ENCOUNTER — Encounter (HOSPITAL_COMMUNITY): Payer: Self-pay

## 2023-06-05 NOTE — Progress Notes (Signed)
 HPI: [name] is a 78 y.o. female with a PMHx significant for HTN, anxiety, insomnia, and HLD, who is here today for her routine physical.  Last CPE: 06/05/2022. No new problems since her last visit.   Exercise: Patient states she walks for at least 30 minutes daily.  Diet: She eats healthy in general and eats vegetables daily.  Sleep: Reports 8-9 hours of sleep per night.  Alcohol Use: none Smoking: She quit smoking in 1982 and says she smoked less than 0.5 ppd for 20 years.  Vision: UTD on routine vision care.  Dental: UTD on routine dental care.   Immunization History  Administered Date(s) Administered   Fluad Trivalent(High Dose 65+) 11/07/2022   Influenza,inj,Quad PF,6+ Mos 12/23/2021   Influenza-Unspecified 12/05/2020   Moderna Covid-19 Fall Seasonal Vaccine 102yrs & older 05/03/2023   PFIZER Comirnaty(Gray Top)Covid-19 Tri-Sucrose Vaccine 04/26/2020   PFIZER(Purple Top)SARS-COV-2 Vaccination 02/16/2019, 03/09/2019   PNEUMOCOCCAL CONJUGATE-20 07/31/2022   Pneumococcal Conjugate-13 08/28/2016   Pneumococcal Polysaccharide-23 05/21/2008, 01/20/2018   Respiratory Syncytial Virus Vaccine,Recomb Aduvanted(Arexvy) 09/22/2021   Td 10/23/2005, 09/07/2009   Tdap 07/31/2022   Health Maintenance  Topic Date Due   Zoster Vaccines- Shingrix (1 of 2) Never done   Medicare Annual Wellness (AWV)  07/26/2021   INFLUENZA VACCINE  08/23/2023   COVID-19 Vaccine (5 - Pfizer risk 2024-25 season) 11/02/2023   Pneumonia Vaccine 25+ Years old  Completed   DEXA SCAN  Completed   Hepatitis C Screening  Completed   HPV VACCINES  Aged Out   Meningococcal B Vaccine  Aged Out   DTaP/Tdap/Td  Discontinued   Colonoscopy  Discontinued   Chronic medical problems:   Hypertension:  Medications: Currently on amlodipine  2.5 mg daily and lisinopril  20 mg daily.  BP readings at home: She checks her BP about once per week and says her systolic readings can be as low as 115.  Side effects:  none Negative for unusual or severe headache, visual changes, exertional chest pain, dyspnea,  focal weakness, or edema.  Lab Results  Component Value Date   CREATININE 0.87 06/05/2022   BUN 16 06/05/2022   NA 138 06/05/2022   K 4.2 06/05/2022   CL 103 06/05/2022   CO2 26 06/05/2022   Hyperlipidemia: Currently on simvastatin  10 mg daily. Also taking aspirin 81 mg daily.  Side effects from medication: none Lab Results  Component Value Date   CHOL 202 (H) 06/05/2022   HDL 73.20 06/05/2022   LDLCALC 116 (H) 06/05/2022   LDLDIRECT 172.5 01/01/2012   TRIG 63.0 06/05/2022   CHOLHDL 3 06/05/2022   Anxiety:  Currently on Lorazepam  0.5 mg 0.5-1 tablet daily as needed for anxiety.   Osteopenia:  Last DEXA scan was in 06/2019.  FRAX score:  Major Osteoporotic Fracture: 9.3% Hip Fracture: 1.5% T-Score -1.3 Left hip She has not been taking calcium or vitamin D.   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 She has an appointment last week.   Concerns today:   Patient states she had a fall in 12/2022 but did not have any significant injuries. She tripped with furniture, she had some posterior thigh pain but it has resolved.  Review of Systems  Constitutional:  Negative for activity change, appetite change, chills and fever.  HENT:  Negative for mouth sores, sore throat and trouble swallowing.   Eyes:  Negative for redness and visual disturbance.  Respiratory:  Negative for cough, shortness of breath and wheezing.   Cardiovascular:  Negative for chest  pain and leg swelling.  Gastrointestinal:  Negative for abdominal pain, nausea and vomiting.  Endocrine: Negative for cold intolerance, heat intolerance, polydipsia, polyphagia and polyuria.  Genitourinary:  Negative for decreased urine volume, dysuria, hematuria, vaginal bleeding and vaginal discharge.  Musculoskeletal:  Negative for gait problem and myalgias.  Skin:  Negative for color change and rash.   Allergic/Immunologic: Positive for environmental allergies.  Neurological:  Negative for syncope, weakness and headaches.  Hematological:  Negative for adenopathy. Does not bruise/bleed easily.  Psychiatric/Behavioral:  Negative for confusion and hallucinations.   All other systems reviewed and are negative.  Current Outpatient Medications on File Prior to Visit  Medication Sig Dispense Refill   aspirin 81 MG tablet Take 81 mg by mouth daily.      lisinopril  (ZESTRIL ) 20 MG tablet TAKE 1 TABLET BY MOUTH EVERY DAY 90 tablet 2   simvastatin  (ZOCOR ) 10 MG tablet 1 tab 3 times per week: Monday, Wednesday,and Saturday. 36 tablet 3   No current facility-administered medications on file prior to visit.   Past Medical History:  Diagnosis Date   Allergic rhinitis    Dislocated shoulder    right    Flying phobia    General anxieety also-xanax  prn   Hematuria    asymptomatic   Hx of colonic polyps    Hypertension    Past Surgical History:  Procedure Laterality Date   HERNIA REPAIR  1999   Dr. Charm Coombs   LAPAROSCOPY Right 08/07/2012   Procedure: LAPAROSCOPY OPERATIVE;  Surgeon: Shasta Deist, MD;  Location: [facility];  Service: Gynecology;  Laterality: Right;  Removal Right Ovarian Cyst Wall, Pelvic Washings   Allergies  Allergen Reactions   Oysters [Shellfish Allergy] Swelling    Swelling of lips next day after eating oysters   Penicillins Hives   Sulfamethoxazole-Trimethoprim Other (See Comments)    REACTION: turned firey red   Family History  Problem Relation Age of Onset   Hypertension Mother    Osteoporosis Mother    Lymphoma Father    Breast cancer Maternal Aunt 30   Social History   Socioeconomic History   Marital status: Married    Spouse name: Not on file   Number of children: Not on file   Years of education: Not on file   Highest education level: Not on file  Occupational History   Not on file  Tobacco Use   Smoking status: Former   Smokeless  tobacco: Never  Substance and Sexual Activity   Alcohol use: Yes    Comment: Occasionnally-social   Drug use: No   Sexual activity: Not on file  Other Topics Concern   Not on file  Social History Narrative   Not on file   Social Drivers of Health   Financial Resource Strain: Low Risk  (07/26/2020)   Overall Financial Resource Strain (CARDIA)    Difficulty of Paying Living Expenses: Not hard at all  Food Insecurity: No Food Insecurity (07/26/2020)   Hunger Vital Sign    Worried About Running Out of Food in the Last Year: Never true    Ran Out of Food in the Last Year: Never true  Transportation Needs: No Transportation Needs (07/26/2020)   PRAPARE - Administrator, Civil Service (Medical): No    Lack of Transportation (Non-Medical): No  Physical Activity: Sufficiently Active (07/26/2020)   Exercise Vital Sign    Days of Exercise per Week: 7 days    Minutes of Exercise per Session: 30 min  Stress: No Stress Concern Present (07/26/2020)   [employer] of Occupational Health - Occupational Stress Questionnaire    Feeling of Stress : Not at all  Social Connections: Moderately Integrated (07/26/2020)   Social Connection and Isolation Panel [NHANES]    Frequency of Communication with Friends and Family: Three times a week    Frequency of Social Gatherings with Friends and Family: Three times a week    Attends Religious Services: Never    Active Member of Clubs or Organizations: Yes    Attends Banker Meetings: More than 4 times per year    Marital Status: Married   Vitals:   06/07/23 0752  BP: 120/78  Pulse: 97  Resp: 16  SpO2: 98%   Body mass index is 21.69 kg/m.  Wt Readings from Last 3 Encounters:  06/07/23 126 lb 6 oz (57.3 kg)  06/05/22 125 lb 2 oz (56.8 kg)  09/27/21 127 lb 3.2 oz (57.7 kg)   Physical Exam Vitals and nursing note reviewed.  Constitutional:      General: She is not in acute distress.    Appearance: She is well-developed.   HENT:     Head: Normocephalic and atraumatic.     Right Ear: Tympanic membrane, ear canal and external ear normal.     Left Ear: External ear normal.     Ears:     Comments: Excess cerumen in left ear canal, could not see TM.    Mouth/Throat:     Mouth: Mucous membranes are moist.     Pharynx: Oropharynx is clear. Uvula midline. Postnasal drip present.  Eyes:     Extraocular Movements: Extraocular movements intact.     Conjunctiva/sclera: Conjunctivae normal.     Pupils: Pupils are equal, round, and reactive to light.  Neck:     Thyroid : No thyroid  mass or thyromegaly.     Trachea: No tracheal deviation.  Cardiovascular:     Rate and Rhythm: Normal rate and regular rhythm.     Pulses:          Dorsalis pedis pulses are 2+ on the right side and 2+ on the left side.     Heart sounds: No murmur heard. Pulmonary:     Effort: Pulmonary effort is normal. No respiratory distress.     Breath sounds: Normal breath sounds.  Abdominal:     Palpations: Abdomen is soft. There is no hepatomegaly or mass.     Tenderness: There is no abdominal tenderness.  Genitourinary:    Comments: No concerns. Musculoskeletal:     Right lower leg: No edema.     Left lower leg: No edema.     Comments: No signs of synovitis appreciated.  Lymphadenopathy:     Cervical: No cervical adenopathy.  Skin:    General: Skin is warm.     Findings: No erythema or rash.  Neurological:     General: No focal deficit present.     Mental Status: She is alert and oriented to person, place, and time.     Cranial Nerves: No cranial nerve deficit.     Sensory: No sensory deficit.     Motor: No weakness.     Gait: Gait normal.     Deep Tendon Reflexes:     Reflex Scores:      Bicep reflexes are 2+ on the right side and 2+ on the left side.      Patellar reflexes are 2+ on the right side and 2+ on the left  side. Psychiatric:        Mood and Affect: Mood and affect normal.   ASSESSMENT AND PLAN:  Ms. TECKLA PHENG was here today for her annual physical examination.  Orders Placed This Encounter  Procedures   DG Bone Density   Comprehensive metabolic panel with GFR   Lipid panel   VITAMIN D 25 Hydroxy (Vit-D Deficiency, Fractures)   ***  Routine general medical examination at a health care facility Assessment & Plan: We discussed the importance of regular physical activity and healthy diet for prevention of chronic illness and/or complications. Preventive guidelines reviewed. Vaccination: Needs shingrix, recommend to get it at her pharmacy. Ca++ and vit D supplementation recommended. Next CPE in a year.   Hyperlipidemia, unspecified hyperlipidemia type Assessment & Plan: Continue simvastatin  10 mg daily and low-fat diet. Further recommendation will be given according to lipid panel result.  Orders: -     Comprehensive metabolic panel with GFR; Future -     Lipid panel; Future  Essential hypertension, benign Assessment & Plan: BP adequately controlled. Continue amlodipine  2.5 mg daily and lisinopril  20 mg daily. Continue monitoring BP regularly, if SBP persistently < 110 we could consider decreasing or stopping one of her meds. Continue low-salt diet. Eye exam is current. As far as problem is stable, we can continue annual follow-ups.   Orders: -     Comprehensive metabolic panel with GFR; Future -     amLODIPine  Besylate; Take 1 tablet (2.5 mg total) by mouth daily.  Dispense: 90 tablet; Refill: 3  Anxiety disorder, unspecified type Assessment & Plan: Problem is stable. Continue lorazepam  0.5 mg 1/2 to 1 tablet daily as needed.   As far as prescription for 30 x 2 refill lasts a year, we can continue following annually, before if more refills are needed.   Osteopenia of left hip Assessment & Plan: Fall prevention discussed. Adequate amount of calcium and vitamin D supplementation recommended. Continue regular physical activity. DEXA order placed.  Orders: -      VITAMIN D 25 Hydroxy (Vit-D Deficiency, Fractures); Future -     DG Bone Density; Future  Insomnia due to other mental disorder -     LORazepam ; Take 1 tablet (0.5 mg total) by mouth daily as needed for anxiety.  Dispense: 30 tablet; Refill: 2  Return in 1 year (on 06/06/2024) for CPE, chronic problems.  I, Nicole [name], acting as a scribe for Sincerity Cedar [place], MD., have documented all relevant documentation on the behalf of Nicole Muzquiz [place], MD, as directed by  Nicole Umland [place], MD while in the presence of Nicole Gurganus [place], MD.   I, Nicole Rorke [place], MD, have reviewed all documentation for this visit. The documentation on 06/07/23 for the exam, diagnosis, procedures, and orders are all accurate and complete.  Nicole Shehadeh G. [place], MD  [facility]. [place] office.

## 2023-06-07 ENCOUNTER — Ambulatory Visit (INDEPENDENT_AMBULATORY_CARE_PROVIDER_SITE_OTHER): Payer: Medicare PPO | Admitting: Family Medicine

## 2023-06-07 ENCOUNTER — Encounter: Payer: Self-pay | Admitting: Family Medicine

## 2023-06-07 VITALS — BP 120/78 | HR 97 | Resp 16 | Ht 64.0 in | Wt 126.4 lb

## 2023-06-07 DIAGNOSIS — F5105 Insomnia due to other mental disorder: Secondary | ICD-10-CM | POA: Diagnosis not present

## 2023-06-07 DIAGNOSIS — F419 Anxiety disorder, unspecified: Secondary | ICD-10-CM | POA: Diagnosis not present

## 2023-06-07 DIAGNOSIS — E785 Hyperlipidemia, unspecified: Secondary | ICD-10-CM | POA: Diagnosis not present

## 2023-06-07 DIAGNOSIS — F99 Mental disorder, not otherwise specified: Secondary | ICD-10-CM

## 2023-06-07 DIAGNOSIS — Z0000 Encounter for general adult medical examination without abnormal findings: Secondary | ICD-10-CM | POA: Diagnosis not present

## 2023-06-07 DIAGNOSIS — M85852 Other specified disorders of bone density and structure, left thigh: Secondary | ICD-10-CM | POA: Diagnosis not present

## 2023-06-07 DIAGNOSIS — I10 Essential (primary) hypertension: Secondary | ICD-10-CM | POA: Diagnosis not present

## 2023-06-07 DIAGNOSIS — M8580 Other specified disorders of bone density and structure, unspecified site: Secondary | ICD-10-CM | POA: Insufficient documentation

## 2023-06-07 LAB — COMPREHENSIVE METABOLIC PANEL WITH GFR
ALT: 15 U/L (ref 0–35)
AST: 21 U/L (ref 0–37)
Albumin: 4.4 g/dL (ref 3.5–5.2)
Alkaline Phosphatase: 62 U/L (ref 39–117)
BUN: 14 mg/dL (ref 6–23)
CO2: 26 meq/L (ref 19–32)
Calcium: 9.4 mg/dL (ref 8.4–10.5)
Chloride: 102 meq/L (ref 96–112)
Creatinine, Ser: 0.93 mg/dL (ref 0.40–1.20)
GFR: 59.08 mL/min — ABNORMAL LOW (ref >60.00)
Glucose, Bld: 98 mg/dL (ref 70–99)
Potassium: 4.3 meq/L (ref 3.5–5.1)
Sodium: 136 meq/L (ref 135–145)
Total Bilirubin: 0.5 mg/dL (ref 0.2–1.2)
Total Protein: 7.2 g/dL (ref 6.0–8.3)

## 2023-06-07 LAB — LIPID PANEL
Cholesterol: 219 mg/dL — ABNORMAL HIGH (ref 0–200)
HDL: 69.2 mg/dL (ref >39.00)
LDL Cholesterol: 133 mg/dL — ABNORMAL HIGH (ref 0–99)
NonHDL: 149.82
Total CHOL/HDL Ratio: 3
Triglycerides: 83 mg/dL (ref 0.0–149.0)
VLDL: 16.6 mg/dL (ref 0.0–40.0)

## 2023-06-07 MED ORDER — AMLODIPINE BESYLATE 2.5 MG PO TABS: 2.5000 mg | ORAL_TABLET | Freq: Every day | ORAL | 3 refills | Status: AC

## 2023-06-07 MED ORDER — LORAZEPAM 0.5 MG PO TABS
0.5000 mg | ORAL_TABLET | Freq: Every day | ORAL | 2 refills | Status: DC | PRN
Start: 2023-06-07 — End: 2023-12-13

## 2023-06-07 NOTE — Assessment & Plan Note (Addendum)
 Problem is stable. Continue lorazepam  0.5 mg 1/2 to 1 tablet daily as needed.   As far as prescription for 30 x 2 refill lasts a year, we can continue following annually, before if more refills are needed.

## 2023-06-07 NOTE — Assessment & Plan Note (Signed)
 We discussed the importance of regular physical activity and healthy diet for prevention of chronic illness and/or complications. Preventive guidelines reviewed. Vaccination: Needs shingrix, recommend to get it at her pharmacy. Ca++ and vit D supplementation recommended. Next CPE in a year.

## 2023-06-07 NOTE — Assessment & Plan Note (Signed)
 BP adequately controlled. Continue amlodipine  2.5 mg daily and lisinopril  20 mg daily. Continue monitoring BP regularly, if SBP persistently < 110 we could consider decreasing or stopping one of her meds. Continue low-salt diet. Eye exam is current. As far as problem is stable, we can continue annual follow-ups.

## 2023-06-07 NOTE — Assessment & Plan Note (Signed)
 Fall prevention discussed. Adequate amount of calcium and vitamin D supplementation recommended. Continue regular physical activity. DEXA order placed.

## 2023-06-07 NOTE — Assessment & Plan Note (Signed)
Continue simvastatin 10 mg daily and low-fat diet. Further recommendation will be given according to lipid panel result. 

## 2023-06-11 DIAGNOSIS — L905 Scar conditions and fibrosis of skin: Secondary | ICD-10-CM | POA: Diagnosis not present

## 2023-06-11 DIAGNOSIS — D2239 Melanocytic nevi of other parts of face: Secondary | ICD-10-CM | POA: Diagnosis not present

## 2023-06-11 DIAGNOSIS — L570 Actinic keratosis: Secondary | ICD-10-CM | POA: Diagnosis not present

## 2023-06-11 DIAGNOSIS — D2261 Melanocytic nevi of right upper limb, including shoulder: Secondary | ICD-10-CM | POA: Diagnosis not present

## 2023-06-11 DIAGNOSIS — D225 Melanocytic nevi of trunk: Secondary | ICD-10-CM | POA: Diagnosis not present

## 2023-06-11 DIAGNOSIS — L821 Other seborrheic keratosis: Secondary | ICD-10-CM | POA: Diagnosis not present

## 2023-06-11 DIAGNOSIS — D224 Melanocytic nevi of scalp and neck: Secondary | ICD-10-CM | POA: Diagnosis not present

## 2023-06-11 DIAGNOSIS — D2262 Melanocytic nevi of left upper limb, including shoulder: Secondary | ICD-10-CM | POA: Diagnosis not present

## 2023-06-11 DIAGNOSIS — D692 Other nonthrombocytopenic purpura: Secondary | ICD-10-CM | POA: Diagnosis not present

## 2023-06-11 LAB — VITAMIN D 25 HYDROXY (VIT D DEFICIENCY, FRACTURES): VITD: 22.78 ng/mL — ABNORMAL LOW (ref 30.00–100.00)

## 2023-06-13 ENCOUNTER — Ambulatory Visit: Payer: Self-pay | Admitting: Family Medicine

## 2023-06-29 ENCOUNTER — Other Ambulatory Visit: Payer: Self-pay | Admitting: Family Medicine

## 2023-06-29 DIAGNOSIS — E785 Hyperlipidemia, unspecified: Secondary | ICD-10-CM

## 2023-07-04 ENCOUNTER — Ambulatory Visit

## 2023-11-10 IMAGING — MG MM DIGITAL SCREENING BILAT W/ TOMO AND CAD
8 series · 9 of 24 positions shown · non-contrast
Comparison: Previous exam(s).

CLINICAL DATA: Screening.

EXAM:
DIGITAL SCREENING BILATERAL MAMMOGRAM WITH TOMOSYNTHESIS AND CAD
TECHNIQUE: Bilateral screening digital craniocaudal and mediolateral oblique
mammograms were obtained. Bilateral screening digital breast
tomosynthesis was performed. The images were evaluated with
computer-aided detection.

[L CC synth-2D]
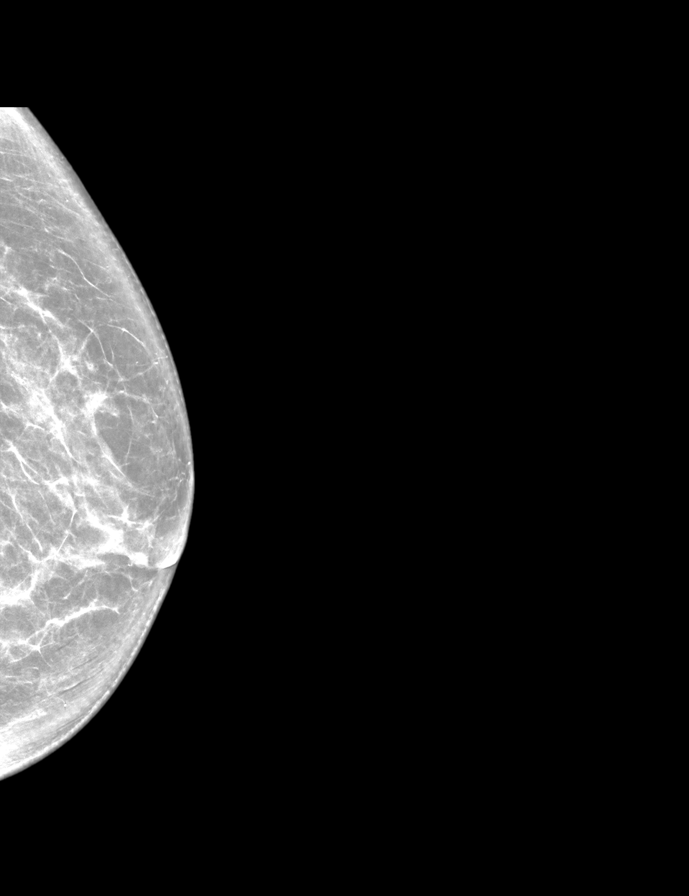

[R MLO synth-2D]
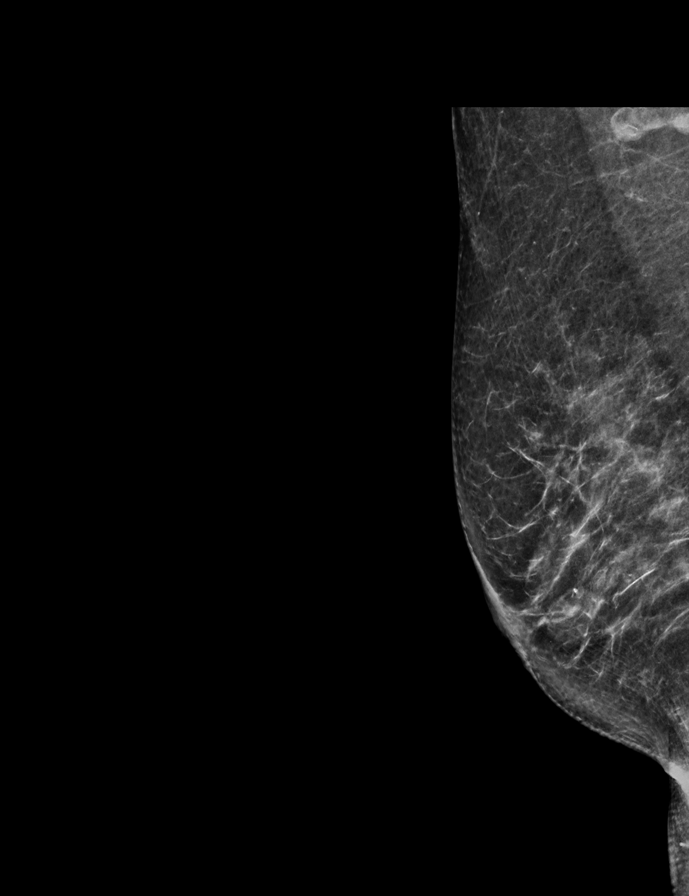

[L MLO synth-2D]
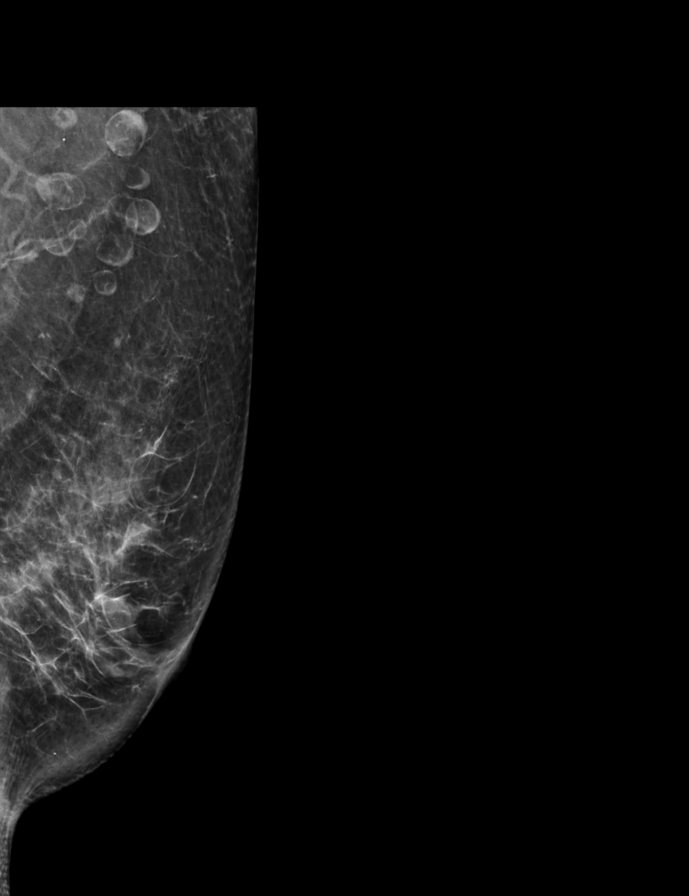

[R CC synth-2D]
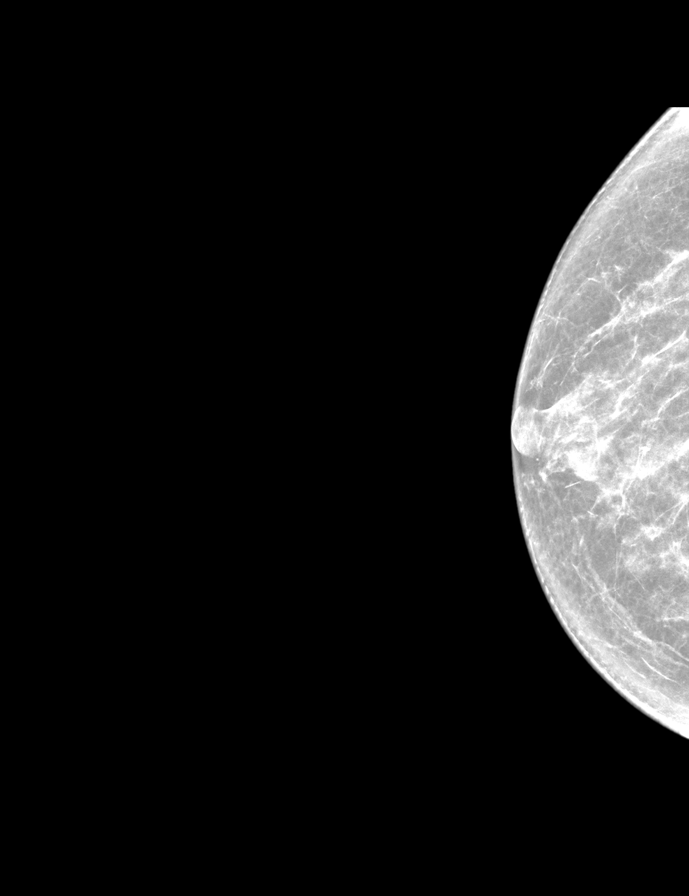

[L MLO tomo · 2 of 76 frames shown]
[frame 25/76]
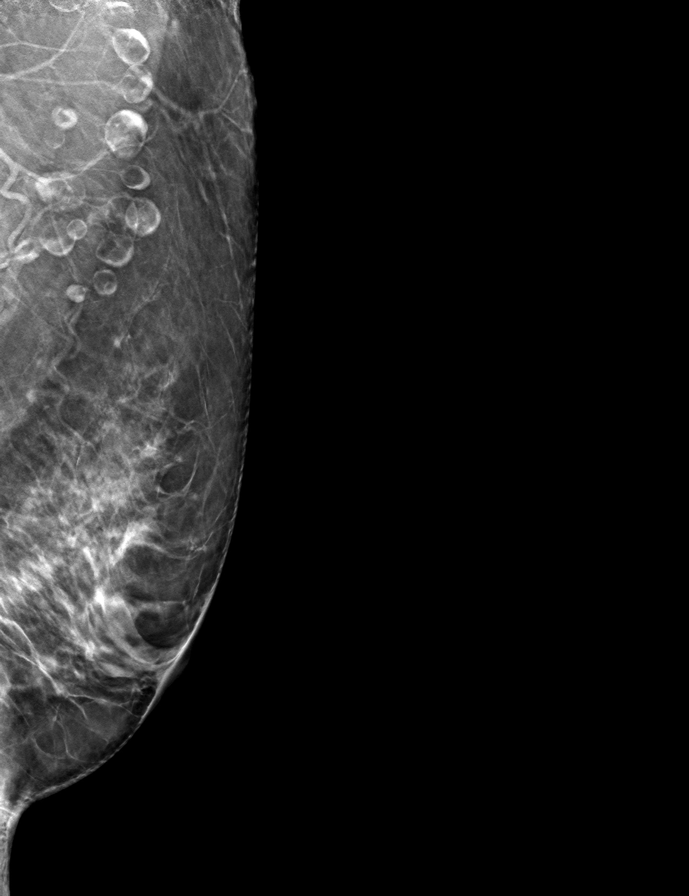
[frame 39/76]
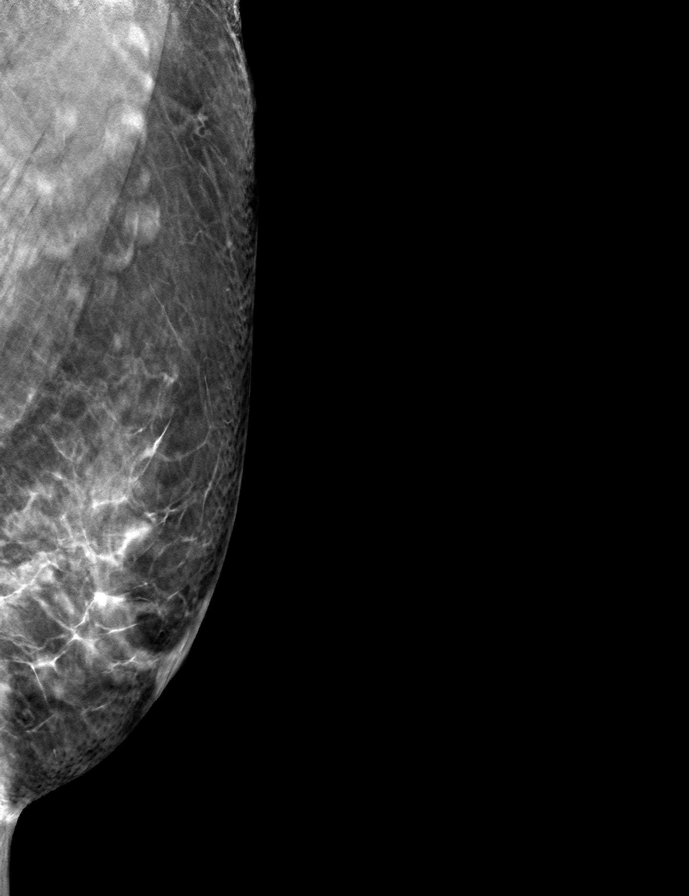

[R MLO tomo · tomo slice 35/70.0]
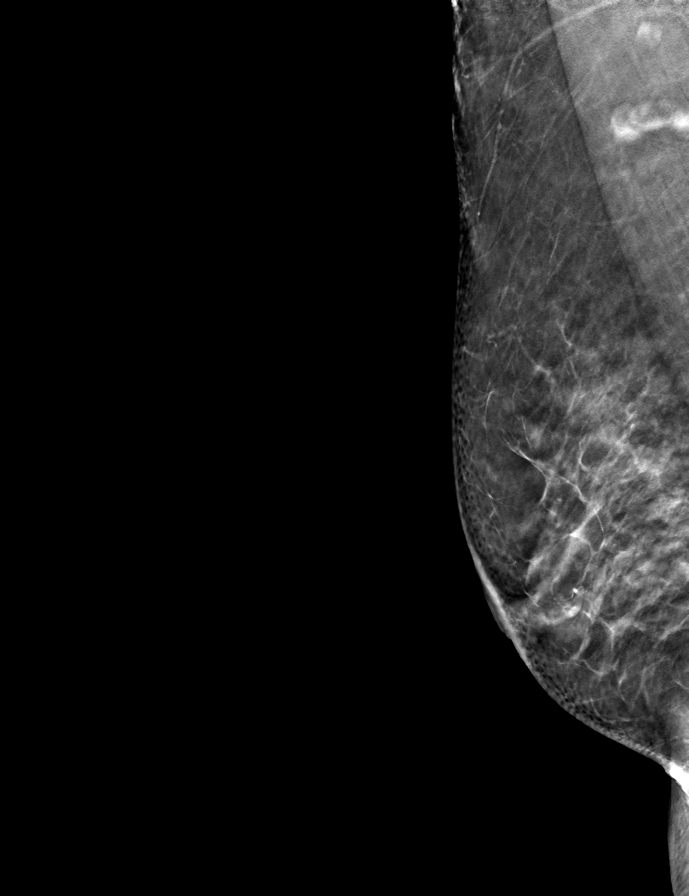

[L CC tomo · tomo slice 35/69.0]
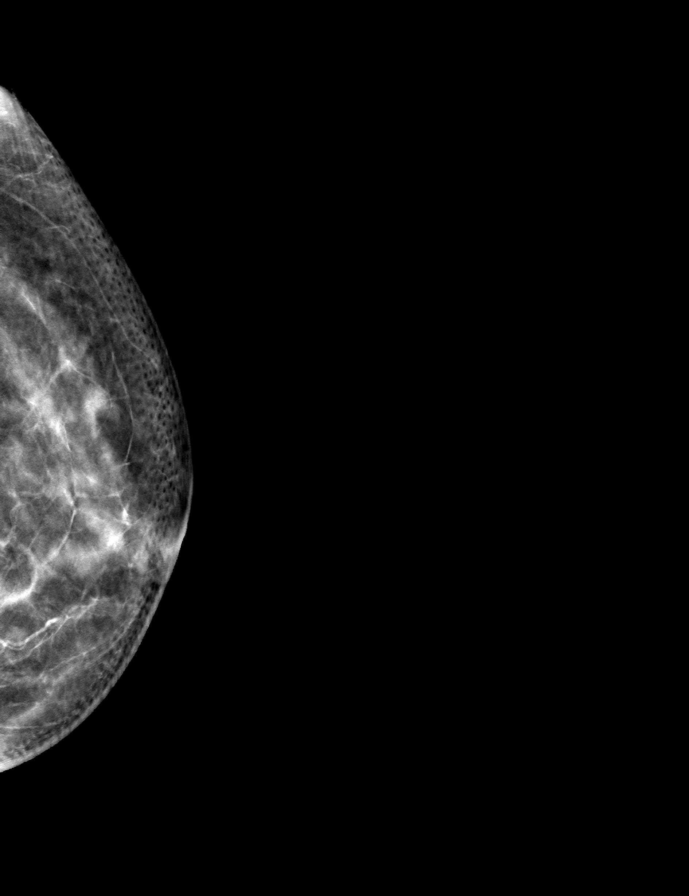

[R CC tomo · tomo slice 31/61.0]
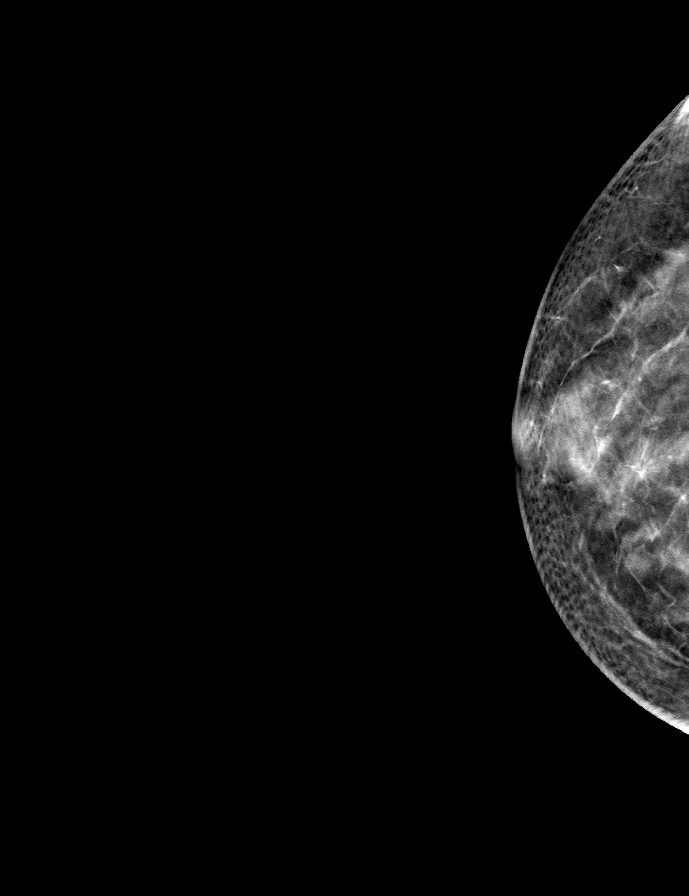

[9 of 24 positions shown; findings below may reference images not displayed]

ACR Breast Density Category c: The breast tissue is heterogeneously
dense, which may obscure small masses.
FINDINGS: There are no findings suspicious for malignancy.
IMPRESSION: No mammographic evidence of malignancy. A result letter of this
screening mammogram will be mailed directly to the patient.

RECOMMENDATION:
Screening mammogram in one year. (Code:Q3-W-BC3)

BI-RADS CATEGORY  1: Negative.

## 2023-12-09 DIAGNOSIS — H43813 Vitreous degeneration, bilateral: Secondary | ICD-10-CM | POA: Diagnosis not present

## 2023-12-09 DIAGNOSIS — H02824 Cysts of left upper eyelid: Secondary | ICD-10-CM | POA: Diagnosis not present

## 2023-12-09 DIAGNOSIS — D3132 Benign neoplasm of left choroid: Secondary | ICD-10-CM | POA: Diagnosis not present

## 2023-12-09 DIAGNOSIS — H26492 Other secondary cataract, left eye: Secondary | ICD-10-CM | POA: Diagnosis not present

## 2023-12-13 ENCOUNTER — Other Ambulatory Visit: Payer: Self-pay | Admitting: Family Medicine

## 2023-12-13 DIAGNOSIS — F5105 Insomnia due to other mental disorder: Secondary | ICD-10-CM

## 2024-02-20 ENCOUNTER — Ambulatory Visit: Admitting: Adult Health

## 2024-02-20 ENCOUNTER — Encounter: Payer: Self-pay | Admitting: Adult Health

## 2024-02-20 VITALS — BP 120/80 | HR 92 | Ht 64.0 in | Wt 124.0 lb

## 2024-02-20 DIAGNOSIS — R058 Other specified cough: Secondary | ICD-10-CM

## 2024-02-20 MED ORDER — HYDROCODONE BIT-HOMATROP MBR 5-1.5 MG/5ML PO SOLN: 5.0000 mL | Freq: Three times a day (TID) | ORAL | 0 refills | Status: AC | PRN

## 2024-02-20 MED ORDER — BENZONATATE 200 MG PO CAPS: 200.0000 mg | ORAL_CAPSULE | Freq: Three times a day (TID) | ORAL | 0 refills | Status: AC | PRN

## 2024-02-20 NOTE — Patient Instructions (Addendum)
 It was great seeing you today. Your exam did not show any signs of pneumonia or bronchitis. As we discussed I think you have a post viral cough and this usually resolves on it's own.  I have sent in a prescription for Tessalon  pearls that you can take during the day time and Hycodan cough syrup to use at nighttime - this will make you sleepy  Please let us  know if symptoms are not improving in the next 1-2 weeks.

## 2024-02-20 NOTE — Progress Notes (Signed)
 "  Subjective:    Patient ID: Nicole [name], female    DOB: 18-Sep-1945, 79 y.o.   MRN: [id number]  HPI Discussed the use of AI scribe software for clinical note transcription with the patient, who gave verbal consent to proceed.  History of Present Illness   Nicole [name] is a 79 year old female who presents with a persistent cough for one week.  She reports the cough began one week ago with a runny nose and has progressed into her chest. It is mainly dry with occasional sputum. Her husband notes frequent coughing at night.  She denies fever, chills, sinus pain or pressure, wheezing, or shortness of breath, and she can climb stairs without difficulty.  She is taking Claritin and an over-the-counter cough medicine Delsym, which helps.        Review of Systems See HPI   Past Medical History:  Diagnosis Date   Allergic rhinitis    Dislocated shoulder    right    Flying phobia    General anxieety also-xanax  prn   Hematuria    asymptomatic   Hx of colonic polyps    Hypertension     Social History   Socioeconomic History   Marital status: Married    Spouse name: Not on file   Number of children: Not on file   Years of education: Not on file   Highest education level: Not on file  Occupational History   Not on file  Tobacco Use   Smoking status: Former   Smokeless tobacco: Never  Substance and Sexual Activity   Alcohol use: Yes    Comment: Occasionnally-social   Drug use: No   Sexual activity: Not on file  Other Topics Concern   Not on file  Social History Narrative   Not on file   Social Drivers of Health   Tobacco Use: Medium Risk (02/20/2024)   Patient History    Smoking Tobacco Use: Former    Smokeless Tobacco Use: Never    Passive Exposure: Not on Actuary Strain: Not on file  Food Insecurity: Not on file  Transportation Needs: Not on file  Physical Activity: Not on file  Stress: Not on file  Social Connections: Not on file   Intimate Partner Violence: Not on file  Depression (PHQ2-9): Low Risk (06/07/2023)   Depression (PHQ2-9)    PHQ-2 Score: 0  Alcohol Screen: Not on file  Housing: Not on file  Utilities: Not on file  Health Literacy: Not on file    Past Surgical History:  Procedure Laterality Date   HERNIA REPAIR  1999   Dr. Maude Hopper   LAPAROSCOPY Right 08/07/2012   Procedure: LAPAROSCOPY OPERATIVE;  Surgeon: Robbi JONELLE Render, MD;  Location: [facility];  Service: Gynecology;  Laterality: Right;  Removal Right Ovarian Cyst Wall, Pelvic Washings    Family History  Problem Relation Age of Onset   Hypertension Mother    Osteoporosis Mother    Lymphoma Father    Breast cancer Maternal Aunt 30    Allergies[1]  Medications Ordered Prior to Encounter[2]  BP 120/80   Pulse 92   Ht 5' 4 (1.626 m)   Wt 124 lb (56.2 kg)   SpO2 98%   BMI 21.28 kg/m       Objective:   Physical Exam Vitals and nursing note reviewed.  Constitutional:      Appearance: Normal appearance.  HENT:     Nose: No congestion or rhinorrhea.  Right Turbinates: Not enlarged or swollen.     Left Turbinates: Not enlarged or swollen.     Right Sinus: No maxillary sinus tenderness or frontal sinus tenderness.     Left Sinus: No maxillary sinus tenderness or frontal sinus tenderness.     Mouth/Throat:     Pharynx: Oropharynx is clear. Uvula midline. No pharyngeal swelling or oropharyngeal exudate.  Cardiovascular:     Rate and Rhythm: Normal rate and regular rhythm.     Pulses: Normal pulses.     Heart sounds: Normal heart sounds.  Pulmonary:     Effort: Pulmonary effort is normal.     Breath sounds: Normal breath sounds. No wheezing, rhonchi or rales.  Musculoskeletal:        General: Normal range of motion.  Skin:    General: Skin is warm and dry.     Capillary Refill: Capillary refill takes less than 2 seconds.  Neurological:     General: No focal deficit present.     Mental Status: She is alert and  oriented to person, place, and time.  Psychiatric:        Mood and Affect: Mood normal.        Behavior: Behavior normal.        Thought Content: Thought content normal.        Judgment: Judgment normal.        Assessment & Plan:  Assessment and Plan    Post-viral cough syndrome Persistent dry cough likely post-viral. - Prescribed Tessalyn TID for daytime cough. - Prescribed hycodan cough syrup for nighttime use- she is aware that this can cause drowsiness.  - Advised hydration.           [1]  Allergies Allergen Reactions   Oysters [Shellfish Allergy] Swelling    Swelling of lips next day after eating oysters   Penicillins Hives   Sulfamethoxazole-Trimethoprim Other (See Comments)    REACTION: turned firey red  [2]  Current Outpatient Medications on File Prior to Visit  Medication Sig Dispense Refill   amLODipine  (NORVASC ) 2.5 MG tablet Take 1 tablet (2.5 mg total) by mouth daily. 90 tablet 3   aspirin 81 MG tablet Take 81 mg by mouth daily.      lisinopril  (ZESTRIL ) 20 MG tablet TAKE 1 TABLET BY MOUTH EVERY DAY 90 tablet 2   LORazepam  (ATIVAN ) 0.5 MG tablet TAKE 1 TABLET BY MOUTH EVERY DAY AS NEEDED FOR ANXIETY 30 tablet 0   simvastatin  (ZOCOR ) 10 MG tablet TAKE 1 TABLET ON MONDAY, WEDNESDAY, AND SATURDAYS 36 tablet 3   No current facility-administered medications on file prior to visit.   "

## 2024-02-21 ENCOUNTER — Ambulatory Visit: Admitting: Family Medicine
